# Patient Record
Sex: Female | Born: 1975 | Race: Black or African American | Hispanic: No | Marital: Married
Health system: Midwestern US, Community
[De-identification: ages and names within clinical notes are randomized; demographics above are authoritative.]

## PROBLEM LIST (undated history)

## (undated) DIAGNOSIS — Z860100 Personal history of colon polyps, unspecified: Principal | ICD-10-CM

## (undated) DIAGNOSIS — K449 Diaphragmatic hernia without obstruction or gangrene: Secondary | ICD-10-CM

## (undated) DIAGNOSIS — K219 Gastro-esophageal reflux disease without esophagitis: Secondary | ICD-10-CM

## (undated) DIAGNOSIS — K222 Esophageal obstruction: Secondary | ICD-10-CM

## (undated) HISTORY — PX: WISDOM TOOTH EXTRACTION: SHX21

## (undated) HISTORY — DX: Diaphragmatic hernia without obstruction or gangrene: K44.9

## (undated) HISTORY — PX: DIAGNOSTIC LAPAROSCOPY: SUR761

## (undated) HISTORY — DX: Gastro-esophageal reflux disease without esophagitis: K21.9

## (undated) HISTORY — PX: CHOLECYSTECTOMY: SHX55

## (undated) HISTORY — PX: TUBAL LIGATION: SHX77

## (undated) HISTORY — PX: BREAST REDUCTION SURGERY: SHX8

## (undated) HISTORY — PX: TONSILLECTOMY: SUR1361

## (undated) HISTORY — DX: Esophageal obstruction: K22.2

---

## 1997-11-02 ENCOUNTER — Other Ambulatory Visit: Admission: RE | Admit: 1997-11-02 | Discharge: 1997-11-02 | Payer: Self-pay | Admitting: Obstetrics and Gynecology

## 1998-11-03 ENCOUNTER — Other Ambulatory Visit: Admission: RE | Admit: 1998-11-03 | Discharge: 1998-11-03 | Payer: Self-pay | Admitting: Obstetrics and Gynecology

## 1998-11-27 ENCOUNTER — Emergency Department (HOSPITAL_COMMUNITY): Admission: EM | Admit: 1998-11-27 | Discharge: 1998-11-27 | Payer: Self-pay | Admitting: Emergency Medicine

## 1999-08-13 ENCOUNTER — Emergency Department (HOSPITAL_COMMUNITY): Admission: EM | Admit: 1999-08-13 | Discharge: 1999-08-13 | Payer: Self-pay | Admitting: Emergency Medicine

## 2000-07-30 ENCOUNTER — Other Ambulatory Visit: Admission: RE | Admit: 2000-07-30 | Discharge: 2000-07-30 | Payer: Self-pay | Admitting: Obstetrics and Gynecology

## 2001-06-06 ENCOUNTER — Encounter: Payer: Self-pay | Admitting: Internal Medicine

## 2001-06-06 ENCOUNTER — Ambulatory Visit (HOSPITAL_COMMUNITY): Admission: RE | Admit: 2001-06-06 | Discharge: 2001-06-06 | Payer: Self-pay | Admitting: Internal Medicine

## 2001-06-06 DIAGNOSIS — K449 Diaphragmatic hernia without obstruction or gangrene: Secondary | ICD-10-CM | POA: Insufficient documentation

## 2003-07-29 ENCOUNTER — Other Ambulatory Visit: Admission: RE | Admit: 2003-07-29 | Discharge: 2003-07-29 | Payer: Self-pay | Admitting: Obstetrics and Gynecology

## 2004-08-04 ENCOUNTER — Other Ambulatory Visit: Admission: RE | Admit: 2004-08-04 | Discharge: 2004-08-04 | Payer: Self-pay | Admitting: Obstetrics and Gynecology

## 2004-08-05 ENCOUNTER — Emergency Department (HOSPITAL_COMMUNITY): Admission: AD | Admit: 2004-08-05 | Discharge: 2004-08-05 | Payer: Self-pay | Admitting: Family Medicine

## 2004-10-26 ENCOUNTER — Ambulatory Visit: Payer: Self-pay | Admitting: Internal Medicine

## 2004-12-26 ENCOUNTER — Ambulatory Visit: Payer: Self-pay | Admitting: Internal Medicine

## 2004-12-30 ENCOUNTER — Inpatient Hospital Stay (HOSPITAL_COMMUNITY): Admission: AD | Admit: 2004-12-30 | Discharge: 2004-12-30 | Payer: Self-pay | Admitting: Obstetrics and Gynecology

## 2005-02-21 ENCOUNTER — Other Ambulatory Visit (HOSPITAL_COMMUNITY): Admission: RE | Admit: 2005-02-21 | Discharge: 2005-02-27 | Payer: Self-pay | Admitting: Psychiatry

## 2005-02-21 ENCOUNTER — Ambulatory Visit: Payer: Self-pay | Admitting: Psychiatry

## 2005-07-04 ENCOUNTER — Inpatient Hospital Stay (HOSPITAL_COMMUNITY): Admission: AD | Admit: 2005-07-04 | Discharge: 2005-07-07 | Payer: Self-pay | Admitting: Obstetrics and Gynecology

## 2005-08-31 ENCOUNTER — Other Ambulatory Visit: Admission: RE | Admit: 2005-08-31 | Discharge: 2005-08-31 | Payer: Self-pay | Admitting: Obstetrics and Gynecology

## 2005-09-21 ENCOUNTER — Other Ambulatory Visit: Admission: RE | Admit: 2005-09-21 | Discharge: 2005-09-21 | Payer: Self-pay | Admitting: Obstetrics and Gynecology

## 2006-02-11 ENCOUNTER — Other Ambulatory Visit: Admission: RE | Admit: 2006-02-11 | Discharge: 2006-02-11 | Payer: Self-pay | Admitting: Obstetrics and Gynecology

## 2006-05-24 ENCOUNTER — Encounter (INDEPENDENT_AMBULATORY_CARE_PROVIDER_SITE_OTHER): Payer: Self-pay | Admitting: *Deleted

## 2006-05-24 ENCOUNTER — Ambulatory Visit (HOSPITAL_COMMUNITY): Admission: RE | Admit: 2006-05-24 | Discharge: 2006-05-24 | Payer: Self-pay | Admitting: Obstetrics and Gynecology

## 2006-06-20 ENCOUNTER — Ambulatory Visit: Payer: Self-pay | Admitting: Internal Medicine

## 2006-08-16 ENCOUNTER — Emergency Department (HOSPITAL_COMMUNITY): Admission: EM | Admit: 2006-08-16 | Discharge: 2006-08-17 | Payer: Self-pay | Admitting: *Deleted

## 2007-03-05 ENCOUNTER — Ambulatory Visit: Payer: Self-pay | Admitting: Internal Medicine

## 2007-05-10 DIAGNOSIS — K222 Esophageal obstruction: Secondary | ICD-10-CM

## 2007-05-10 DIAGNOSIS — K219 Gastro-esophageal reflux disease without esophagitis: Secondary | ICD-10-CM | POA: Insufficient documentation

## 2007-05-10 DIAGNOSIS — K319 Disease of stomach and duodenum, unspecified: Secondary | ICD-10-CM

## 2007-10-29 ENCOUNTER — Encounter: Payer: Self-pay | Admitting: Internal Medicine

## 2007-11-05 ENCOUNTER — Telehealth: Payer: Self-pay | Admitting: Internal Medicine

## 2007-12-23 ENCOUNTER — Inpatient Hospital Stay (HOSPITAL_COMMUNITY): Admission: AD | Admit: 2007-12-23 | Discharge: 2007-12-23 | Payer: Self-pay | Admitting: Obstetrics and Gynecology

## 2008-04-20 ENCOUNTER — Telehealth: Payer: Self-pay | Admitting: Internal Medicine

## 2008-06-25 ENCOUNTER — Inpatient Hospital Stay (HOSPITAL_COMMUNITY): Admission: AD | Admit: 2008-06-25 | Discharge: 2008-06-25 | Payer: Self-pay | Admitting: Obstetrics and Gynecology

## 2008-07-25 ENCOUNTER — Inpatient Hospital Stay (HOSPITAL_COMMUNITY): Admission: AD | Admit: 2008-07-25 | Discharge: 2008-07-25 | Payer: Self-pay | Admitting: Obstetrics and Gynecology

## 2008-07-27 ENCOUNTER — Inpatient Hospital Stay (HOSPITAL_COMMUNITY): Admission: AD | Admit: 2008-07-27 | Discharge: 2008-07-30 | Payer: Self-pay | Admitting: Obstetrics and Gynecology

## 2008-07-27 ENCOUNTER — Encounter (INDEPENDENT_AMBULATORY_CARE_PROVIDER_SITE_OTHER): Payer: Self-pay | Admitting: Obstetrics and Gynecology

## 2008-11-23 ENCOUNTER — Ambulatory Visit: Payer: Self-pay | Admitting: Internal Medicine

## 2008-11-23 DIAGNOSIS — R1013 Epigastric pain: Secondary | ICD-10-CM

## 2008-11-23 LAB — CONVERTED CEMR LAB
AST: 16 units/L (ref 0–37)
Albumin: 3.4 g/dL — ABNORMAL LOW (ref 3.5–5.2)
Basophils Absolute: 0 10*3/uL (ref 0.0–0.1)
CO2: 30 meq/L (ref 19–32)
GFR calc non Af Amer: 123.02 mL/min (ref >60)
Lipase: 9 units/L — ABNORMAL LOW (ref 11.0–59.0)
Lymphocytes Relative: 25.3 % (ref 12.0–46.0)
Lymphs Abs: 1.7 10*3/uL (ref 0.7–4.0)
MCHC: 34.2 g/dL (ref 30.0–36.0)
Monocytes Absolute: 0.5 10*3/uL (ref 0.1–1.0)
Monocytes Relative: 7.2 % (ref 3.0–12.0)
Neutro Abs: 4.3 10*3/uL (ref 1.4–7.7)
Potassium: 4.1 meq/L (ref 3.5–5.1)
Sodium: 140 meq/L (ref 135–145)
Total Bilirubin: 0.8 mg/dL (ref 0.3–1.2)
WBC: 6.9 10*3/uL (ref 4.5–10.5)

## 2008-11-26 ENCOUNTER — Ambulatory Visit (HOSPITAL_COMMUNITY): Admission: RE | Admit: 2008-11-26 | Discharge: 2008-11-26 | Payer: Self-pay | Admitting: Internal Medicine

## 2008-11-26 DIAGNOSIS — K8020 Calculus of gallbladder without cholecystitis without obstruction: Secondary | ICD-10-CM | POA: Insufficient documentation

## 2008-11-29 ENCOUNTER — Encounter: Payer: Self-pay | Admitting: Internal Medicine

## 2008-12-13 ENCOUNTER — Encounter: Payer: Self-pay | Admitting: Internal Medicine

## 2008-12-31 ENCOUNTER — Emergency Department (HOSPITAL_COMMUNITY): Admission: EM | Admit: 2008-12-31 | Discharge: 2008-12-31 | Payer: Self-pay | Admitting: Family Medicine

## 2009-01-13 ENCOUNTER — Encounter (INDEPENDENT_AMBULATORY_CARE_PROVIDER_SITE_OTHER): Payer: Self-pay | Admitting: General Surgery

## 2009-01-13 ENCOUNTER — Ambulatory Visit (HOSPITAL_COMMUNITY): Admission: RE | Admit: 2009-01-13 | Discharge: 2009-01-13 | Payer: Self-pay | Admitting: General Surgery

## 2009-02-01 ENCOUNTER — Encounter: Payer: Self-pay | Admitting: Internal Medicine

## 2010-01-18 ENCOUNTER — Telehealth: Payer: Self-pay | Admitting: Internal Medicine

## 2010-02-01 ENCOUNTER — Ambulatory Visit: Payer: Self-pay | Admitting: Internal Medicine

## 2010-05-18 NOTE — Assessment & Plan Note (Signed)
Summary: GERD-annual followup   History of Present Illness Visit Type: Follow-up Visit Primary GI MD: Lauren Flemings MD Primary Provider: Velna Ward M.D. Chief Complaint: needs refills of Omeprazole  No GI complaints History of Present Illness:   35 year old female with morbid obesity and GERD consultative a peptic stricture. She presents today for routine office followup and requests a refill of her omeprazole. She was last evaluated in August of 2010 regarding a four-month history of recurrent epigastric pain. She underwent workup including laboratories and abdominal ultrasound. She was found to have cholelithiasis. She subsequently underwent laparoscopic cholecystectomy in September 2010. Her pain resolved. She continues on omeprazole 20 mg daily for her GERD. On medication no reflux symptoms. No problems with dysphagia. No appreciable medication side effects. No interval medical problems. No other GI complaints.   GI Review of Systems      Denies abdominal pain, acid reflux, belching, bloating, chest pain, dysphagia with liquids, dysphagia with solids, heartburn, loss of appetite, nausea, vomiting, vomiting blood, weight loss, and  weight gain.        Denies anal fissure, black tarry stools, change in bowel habit, constipation, diarrhea, diverticulosis, fecal incontinence, heme positive stool, hemorrhoids, irritable bowel syndrome, jaundice, light color stool, liver problems, rectal bleeding, and  rectal pain.    Current Medications (verified): 1)  Omeprazole 20 Mg Tbec (Omeprazole) .... Take 1 Capsule By Mouth Two Times A Day  Allergies (verified): No Known Drug Allergies  Past History:  Past Medical History: Reviewed history from 05/10/2007 and no changes required. 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 No FH of Colon Cancer: Family History of Diabetes: MGM, PGM  Social History: Reviewed history from 11/23/2008 and no changes required. 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  Alcohol Use - no Daily Caffeine Use 2 can sodas/day Illicit Drug Use - no Patient does not get regular exercise.   Review of Systems  The patient denies allergy/sinus, anemia, anxiety-new, arthritis/joint pain, back pain, blood in urine, breast changes/lumps, change in vision, confusion, cough, coughing up blood, depression-new, fainting, fatigue, fever, headaches-new, hearing problems, heart murmur, heart rhythm changes, itching, menstrual pain, muscle pains/cramps, night sweats, nosebleeds, pregnancy symptoms, shortness of breath, skin rash, sleeping problems, sore throat, swelling of feet/legs, swollen lymph glands, thirst - excessive , urination - excessive , urination changes/pain, urine leakage, vision changes, and voice change.    Vital Signs:  Patient profile:   35 year old female Height:      62 inches Weight:      229 pounds BMI:     42.04 Pulse rate:   84 / minute Pulse rhythm:   regular BP sitting:   118 / 74  (left arm)  Vitals Entered By: Milford Cage NCMA (February 01, 2010 11:15 AM)  Physical Exam  General:  Well developed,obese, well nourished, no acute distress. Head:  Normocephalic and atraumatic. Eyes:  PERRLA, no icterus. Mouth:  No deformity or lesions, dentition normal. Neck:  Supple; no masses or thyromegaly. Lungs:  Clear throughout to auscultation. Heart:  Regular rate and rhythm; no murmurs, rubs,  or bruits. Abdomen:  Soft,obese, nontender and nondistended. No masses, hepatosplenomegaly or hernias noted. Normal bowel sounds. Pulses:  Normal pulses noted. Extremities:  no edema Neurologic:  alert and oriented Skin:  no rash or jaundice Psych:  Alert and cooperative. Normal  mood and affect.   Impression  & Recommendations:  Problem # 1:  GERD (ICD-530.81) GERD with history of peptic stricture. Currently asymptomatic on PPI therapy. We discussed the current state of her management.  Plan #1. Continue PPI as she requires this for adequate control of symptoms and to reduce the risk of recurrent symptomatic stricture formation #2. Reflux precautions with significant attention to weight loss #3. Routine GI followup in 1-2 years.  Patient Instructions: 1)  Refill Omeprazole 20 mg #180 x 3 RFS printed and given to pt. 2)  Please schedule a follow-up appointment in 1-2 years. 3)  The medication list was reviewed and reconciled.  All changed / newly prescribed medications were explained.  A complete medication list was provided to the patient / caregiver. 4)  Copy: Dr. Velna Ward Prescriptions: OMEPRAZOLE 20 MG TBEC (OMEPRAZOLE) Take 1 capsule by mouth two times a day  #180 x 3   Entered by:   Milford Cage NCMA   Authorized by:   Hilarie Fredrickson MD   Signed by:   Milford Cage NCMA on 02/01/2010   Method used:   Print then Give to Patient   RxID:   [id number]

## 2010-05-18 NOTE — Progress Notes (Signed)
Summary: Samples of Nexium  Phone Note Call from Patient Call back at Work Phone [phone]   Call For: Dr Marina Goodell Summary of Call: Wonders if there are any Nexium samples she can have? Initial call taken by: Leanor Kail [facility],  January 18, 2010 8:18 AM  Follow-up for Phone Call        Samples left up front for pt. to pick up.  Pt. notified.   Follow-up by: Milford Cage NCMA,  January 18, 2010 9:57 AM

## 2010-05-22 ENCOUNTER — Emergency Department (HOSPITAL_COMMUNITY)
Admission: EM | Admit: 2010-05-22 | Discharge: 2010-05-22 | Disposition: A | Discharge status: Home / Self Care | Payer: BC Managed Care – HMO | Attending: Emergency Medicine | Admitting: Emergency Medicine

## 2010-05-22 ENCOUNTER — Emergency Department (HOSPITAL_COMMUNITY): Payer: BC Managed Care – HMO

## 2010-05-22 DIAGNOSIS — R079 Chest pain, unspecified: Secondary | ICD-10-CM | POA: Insufficient documentation

## 2010-05-22 DIAGNOSIS — K219 Gastro-esophageal reflux disease without esophagitis: Secondary | ICD-10-CM | POA: Insufficient documentation

## 2010-05-22 DIAGNOSIS — D649 Anemia, unspecified: Secondary | ICD-10-CM | POA: Insufficient documentation

## 2010-05-22 LAB — DIFFERENTIAL
Basophils Absolute: 0 10*3/uL (ref 0.0–0.1)
Basophils Relative: 0 % (ref 0–1)
Eosinophils Relative: 5 % (ref 0–5)
Lymphocytes Relative: 23 % (ref 12–46)
Monocytes Relative: 9 % (ref 3–12)
Neutrophils Relative %: 63 % (ref 43–77)

## 2010-05-22 LAB — CBC
MCH: 26.8 pg (ref 26.0–34.0)
MCV: 81.3 fL (ref 78.0–100.0)
Platelets: 290 10*3/uL (ref 150–400)
RBC: 4.07 MIL/uL (ref 3.87–5.11)
RDW: 14.3 % (ref 11.5–15.5)

## 2010-05-22 LAB — POCT CARDIAC MARKERS
CKMB, poc: <1 ng/mL — ABNORMAL LOW (ref 1.0–8.0)
Troponin i, poc: 0.06 ng/mL (ref 0.00–0.09)
Troponin i, poc: <0.05 ng/mL (ref 0.00–0.09)

## 2010-05-22 LAB — BASIC METABOLIC PANEL
CO2: 28 mEq/L (ref 19–32)
Creatinine, Ser: 0.7 mg/dL (ref 0.4–1.2)
GFR calc non Af Amer: >60 mL/min (ref >60)
Potassium: 3.7 mEq/L (ref 3.5–5.1)

## 2010-06-07 ENCOUNTER — Other Ambulatory Visit: Payer: Self-pay | Admitting: Internal Medicine

## 2010-06-13 ENCOUNTER — Ambulatory Visit
Admission: RE | Admit: 2010-06-13 | Discharge: 2010-06-13 | Disposition: A | Discharge status: Home / Self Care | Payer: BC Managed Care – HMO | Source: Ambulatory Visit | Attending: Internal Medicine | Admitting: Internal Medicine

## 2010-07-21 LAB — DIFFERENTIAL
Basophils Absolute: 0 10*3/uL (ref 0.0–0.1)
Basophils Relative: 0 % (ref 0–1)

## 2010-07-21 LAB — COMPREHENSIVE METABOLIC PANEL
CO2: 27 mEq/L (ref 19–32)
Chloride: 107 mEq/L (ref 96–112)
Glucose, Bld: 102 mg/dL — ABNORMAL HIGH (ref 70–99)
Potassium: 3.6 mEq/L (ref 3.5–5.1)
Sodium: 140 mEq/L (ref 135–145)

## 2010-07-21 LAB — CBC
Hemoglobin: 12.8 g/dL (ref 12.0–15.0)
MCV: 87.4 fL (ref 78.0–100.0)
Platelets: 287 10*3/uL (ref 150–400)
WBC: 7.5 10*3/uL (ref 4.0–10.5)

## 2010-07-21 LAB — PREGNANCY, URINE: Preg Test, Ur: NEGATIVE

## 2010-07-26 LAB — CBC
HCT: 32 % — ABNORMAL LOW (ref 36.0–46.0)
Hemoglobin: 11.1 g/dL — ABNORMAL LOW (ref 12.0–15.0)
MCHC: 34.5 g/dL (ref 30.0–36.0)
MCV: 90.7 fL (ref 78.0–100.0)
Platelets: 178 10*3/uL (ref 150–400)
Platelets: 203 10*3/uL (ref 150–400)
RBC: 3.53 MIL/uL — ABNORMAL LOW (ref 3.87–5.11)
RDW: 14.5 % (ref 11.5–15.5)
RDW: 14.6 % (ref 11.5–15.5)
WBC: 10.6 10*3/uL — ABNORMAL HIGH (ref 4.0–10.5)
WBC: 11.4 10*3/uL — ABNORMAL HIGH (ref 4.0–10.5)

## 2010-07-26 LAB — GLUCOSE, CAPILLARY

## 2010-07-26 LAB — RPR: RPR Ser Ql: NONREACTIVE

## 2010-07-26 LAB — WET PREP, GENITAL: Clue Cells Wet Prep HPF POC: NONE SEEN

## 2010-07-27 LAB — URINALYSIS, ROUTINE W REFLEX MICROSCOPIC
Bilirubin Urine: NEGATIVE
Ketones, ur: NEGATIVE mg/dL
Nitrite: NEGATIVE
Specific Gravity, Urine: <1.005 — ABNORMAL LOW (ref 1.005–1.030)
Urobilinogen, UA: 0.2 mg/dL (ref 0.0–1.0)
pH: 6 (ref 5.0–8.0)

## 2010-08-29 NOTE — H&P (Signed)
Lauren Ward, Lauren Ward             ACCOUNT NO.:  [id number]   MEDICAL RECORD NO.:  [id number]          PATIENT TYPE:  OBV   LOCATION:  9162                          [facility]ACILITY:  [facility]   PHYSICIAN:  [name], M.D. DATE OF BIRTH:  07/03/1975   DATE OF ADMISSION:  07/27/2008  DATE OF DISCHARGE:                              HISTORY & PHYSICAL   Ms. Drawdy is a 35 year old gravida 4, para 1-0-2-1 at 39-4/7 weeks who  presents with spontaneous rupture of membranes approximately 9:30 a.m.  with clear fluid noted and with very occasional contractions.  Her  pregnancy has been remarkable for:  1. Large-for-gestational age anticipated infant with estimated fetal      weight 8.14 on March 31.  2. Group B strep negative.  3. Elevated BMI.  4. First-trimester spotting.  5. Gastroesophageal reflux disease.  6. Desires tubal sterilization.   PRENATAL LABS:  Blood type is AB positive, Rh antibody negative, VDRL  nonreactive, rubella titer positive, hepatitis B surface antigen  negative, HIV is nonreactive.  Sickle cell test was negative.  Hemoglobin upon entering the practice was 12.  It was within normal  limits at 28 weeks.  The patient had a normal first-trimester screen.  She declined AFP.  She had a normal Glucola.  Group B strep culture was  negative at 36 weeks.  GC and Chlamydia cultures were also negative at  that time.   HISTORY OF PRESENT PREGNANCY:  The patient entered care at approximately  11 weeks 3 days.  She had an ultrasound at that time with an [facility] of  July 30, 2008 in agreement with [facility] by LMP.  She had a normal first  trimester screen.  She declined AFP.  She had some ligament pain at 19  weeks.  She had an ultrasound at 19 weeks showing an anterior placenta,  normal cervical length, normal growth and fluid.  She received H1N1 at  19 weeks.  At 23 weeks, there was a __________noted.  She had a normal  Glucola.  By 32 weeks, she was measuring size larger than dates.   She  had an ultrasound at 35 weeks showing estimated fetal weight at 7 pounds  8 ounces, BPP of 8/8.  Group B strep culture was negative at 36 weeks.  She had another ultrasound on 03/31 showing estimated fetal weight  greater than 90th percentile with a weight of 8 pounds 14 ounces.  Cervix at that time was 1, 50%, -3.  The rest her pregnancy was  essentially uncomplicated and then she broke her water this morning.   OBSTETRICAL HISTORY:  In 2007 she had a vaginal birth of a female infant  weight 7 pounds 2 ounces of 40 weeks.  She was in labor 9 hours from the  onset of Pitocin use.  She did have spontaneous contractions and  progressed well once Pitocin was begun and she did have an epidural.  In  2008 she had a blighted ovum at 7 weeks and had a D and C per Dr.  Estanislado Pandy.  She had elective termination in 1994.   MEDICAL HISTORY:  She was on oral contraceptives until June 2009.  She  had one abnormal Pap in the past.  Followup was normal.  She has history  of reflux.   She has a sensitivity to LATEX but no anaphylaxis.   PREVIOUS SURGERY:  Includes right toe, tonsils and adenoidectomy in 2008  and a D and C in 2008 for the spontaneous miscarriage.  She also had a  previous elective termination of pregnancy in 1994.   FAMILY HISTORY:  Her father had an MI.  Paternal grandfather had an MI.  Her mother has hypertension and her father has hypertension.  Her  maternal uncle had a leg DVT.  Her mother has anemia.  Her father and  two brothers have asthma.  Paternal uncle has diabetes.  Paternal and  maternal grandmothers have diabetes and another paternal uncle has  diabetes.   GENETIC HISTORY:  Unremarkable.   SOCIAL HISTORY:  The patient is married to father of the baby.  He is  involved in sports and his name is Engineer, manufacturing systems.  The patient is  Tree surgeon.  She denies a religious affiliation.  She is a Statistician.  She is employed in Clinical biochemist.  Her husband has  an  associates degree.  He is a bus Hospital doctor.  She has been followed by the  certified nurse midwife service, [place].  She denies any  alcohol, drug or tobacco use during this pregnancy.   PHYSICAL EXAMINATION:  VITAL SIGNS:  Stable.  Patient is afebrile.  HEENT: Within normal limits.  LUNGS:  Breath sounds are clear.  HEART:  Regular rate and rhythm without murmur.  BREASTS:  Soft and nontender.  ABDOMEN:  Fundal height is approximately 44 cm, estimated fetal weight  is somewhere at 9 to 9-1/2 pounds.  Uterine contractions are very  occasional and mild.  Fetal heart rate is reactive.  There is some  occasional very mild variables noted.  The patient is noted to be  leaking clear fluid and she is having very sporadic mild contractions.  Cervix is 2 cm, 60%, vertex at -2 station with clear fluid noted.  EXTREMITIES:  Deep tendon reflexes are 2+ without clonus.  There is a  trace edema noted.   IMPRESSION:  1. Intrauterine pregnancy at 39-4/7 weeks.  2. Spontaneous rupture of membranes without labor.  3. Group B strep negative.  4. Large-for-gestational age infant.  5. Desires tubal sterilization.   PLAN:  1. Admit to birthing suite with consult with Dr. Normand Sloop as attending      physician.  2. Routine certified nurse midwife orders.  3. Will observe at present and plan Pitocin augmentation if no change      in labor status by 1:30 to 2 o'clock per patient request.  4. Patient plans epidural.  5. Reviewed risk of shoulder dystocia in light of a possible LGA      infant with the patient and her husband.  We will plan MD presence      at delivery and we will watch with close observation of labor      progress.     Renaldo Reel Emilee Hero, C.N.M.      Naima A. Normand Sloop, M.D.  Electronically Signed   VLL/MEDQ  D:  07/27/2008  T:  07/27/2008  Job:  [id number]

## 2010-08-29 NOTE — Discharge Summary (Signed)
[name], Ward             ACCOUNT NO.:  [id number]   MEDICAL RECORD NO.:  [id number]           PATIENT TYPE:   LOCATION:                                 FACILITY:   PHYSICIAN:  Crist Fat. Rivard, M.D.      DATE OF BIRTH:   DATE OF ADMISSION:  07/27/2008  DATE OF DISCHARGE:  07/30/2008                               DISCHARGE SUMMARY   ADMITTING DIAGNOSIS:  1. Intrauterine pregnancy of term.  2. Fetal macrosomia.  3. Spontaneous rupture of membranes.  4. Desires sterilization.   DISCHARGE DIAGNOSES:  Same.   PROCEDURES:  1. Primary low transverse cesarean section.   HOSPITAL COURSE:  Lauren Ward is a 35 year old gravida 3, para 1-0-1-1 at  39-4/7 weeks, who presented on the morning of July 27, 2008, with  spontaneous ruptured membranes at approximately 9:30 a.m.  Clear fluid  noted and very occasional uterine contractions.  Pregnancy had been  remarkable for:  1. Large for gestational age with estimated fetal weight of 814 on      03/31.  2. Group B strep negative.  3. Elevated BMI.  4. First trimester spotting.  5. Gastroesophageal reflux disease.  6. Desires tubal sterilization.   On arrival, the patient's vital signs were stable.  She was afebrile.  Fetal heart rate was reactive with very mild variables.  Uterine  contractions very sporadic and mild.  She was leaking clear fluid.  Her  cervix was 260% vertex 7, minus 2 station with clear fluid noted.  Dr.  Normand Sloop was consulted, and initially the decision was made to observe  for onset of labor, risks of shoulder dystocia were reviewed with the  patient, her husband.  Dr. Normand Sloop was consulted, and she suggested that  if the patient would be interested in choosing a cesarean section should  estimated fetal weight be 5000 grams or greater, then we would do an  ultrasound for estimated fetal weight.  The patient did wish to proceed  with this plan, and ultrasound was done, showing an estimated fetal  weight of 11  pounds 7 ounces which was 5100 grams. The patient did wish  to proceed with cesarean section subsequent to that finding and that the  plan was made.  The findings were reviewed with her husband which she,  and her husband, and risks, and benefits of C-section including  bleeding, infection, damage to other organs, and failure of tubal  ligation were reviewed with the patient.  She did wish to proceed then  with those procedures.  She was taken to the operating room.   PROCEDURES:  She is taken to the operating room where primary low  transverse cesarean section and bilateral tubal sterilization were  performed on the patient by Dr. Normand Sloop.  Please add to the procedures  bilateral tubal ligation in addition to the primary low transverse  cesarean section.   FINDINGS:  Findings were a viable female, weight 10 pounds 15 ounces.  Apgars were 8 and 9.  Infant was taken to the full-term nursery.  Mother  was taken to recovery in good condition.  Postop day #1, the patient is doing well.  She was up ad lib without  syncope.  She was bottle feeding. Her hemoglobin was 11.1, white blood  cell count 10.6, and platelet count was 178.  JP drain had inadvertently  been dislodged  in recovery on 04/13.  That site was clean, dry and  intact and the patient's incision was doing well.  The rest of the  patient's hospital course was uncomplicated.   By post op day #3 she was doing well.  Her incision was clean, dry and  intact.  Fundus was firm.  Lochia was scant.  The infant was doing well.  The patient had good pain management on p.o. pain medications and her  condition was stable.  She was deemed to receive full benefit of  hospital stay and was discharged home.  Discharge instructions per  [facility] handout.   DISCHARGE MEDICATIONS:  Motrin 600 mg p.o. q.6 h p.r.n. pain, Percocet  5/325 one to two p.o. daily 4 hours p.r.n. pain.  HCTZ 25 mg one p.o.  daily was given to the patient  secondary to some increased edema of her  legs.  Discharge followup occur in 6 weeks [facility].      Lauren Ward, C.N.M.      Crist Fat Rivard, M.D.  Electronically Signed    VLL/MEDQ  D:  07/30/2008  T:  07/31/2008  Job:  [id number]

## 2010-08-29 NOTE — Op Note (Signed)
Lauren Ward, BOEHM             ACCOUNT NO.:  [id number]   MEDICAL RECORD NO.:  [id number]          PATIENT TYPE:  INP   LOCATION:  9104                          FACILITY:  [facility]   PHYSICIAN:  [name], M.D. DATE OF BIRTH:  1976/02/23   DATE OF PROCEDURE:  07/27/2008  DATE OF DISCHARGE:                               OPERATIVE REPORT   PREOPERATIVE DIAGNOSES:  Intrauterine pregnancy at term, fetal  macrosomia, spontaneous rupture of membranes, desires sterilization.   POSTOPERATIVE DIAGNOSES:  Intrauterine pregnancy at term, fetal  macrosomia, spontaneous rupture of membranes, desires sterilization.   PROCEDURE:  Primary low-transverse cesarean section and bilateral tubal  ligation.   SURGEON:  Naima A. Normand Sloop, MD   ASSISTANT:  Renaldo Reel. Emilee Hero, C.N.M.   ANESTHESIA:  Spinal.   FINDINGS:  A female on vertex presentation with clear fluid.  Apgars  were 8 and 9 with a weight of 1015.  Normal appearing uterus, tubes and  ovaries and abdominal anatomy.   ESTIMATED BLOOD LOSS:  600 mL.   URINE OUTPUT:  150 mL clear urine at the end of procedure.   IV FLUIDS:  2800 mL crystalloid.   COMPLICATIONS:  There were no complications.  The patient went to PACU  in stable condition.   PROCEDURE IN DETAIL:  Before the surgery, the patient appeared to have  very large baby, and we talked to her about the risk of shoulder  dystocia.  The patient decided to have an ultrasound.  Ultrasound  measurements, the was an estimated fetal weight of 10-11 pounds, plus or  minus a pound.  The patient was offered a C-section and she decided to  proceed.  She understood the risk but are not limited to bleeding,  infection, damage to internal organs such as bowel, bladder, major blood  vessels.  She also wanted a tubal ligation.  The birth control was  reviewed with the patient.  The patient understood the risk of tubal to  be bleeding, infection, and ectopic pregnancy, or pregnancy in about 1  in 200 patients who have the tubal, 50% of those could result in  ectopic.  The patient understood and decided to proceed.  She was taken  to the operating room, placed in dorsal supine position after spinal  anesthesia was placed.  A spinal incision was found to be adequate.  A  Pfannenstiel skin incision was made with the scalpel and carried down to  the fascia using Bovie cautery.  The fascia was incised in the midline,  extended bilaterally using Mayo.  Kochers x2 were placed in the superior  aspect of the fascia was dissected off the rectus muscle both sharply  and bluntly.  The inferior aspect of the fascia was dissected off the  rectus muscle in a similar fashion.  Rectus muscle was separated in the  midline.  Peritoneum was identified, tented up and entered sharply, and  extended bluntly.  A bladder blade was placed.  Vesicouterine peritoneum  was identified, tented up and entered sharply, and bladder flap created  digitally.  Bladder blade was replaced.  Primary low transverse uterine  incision was made with the scalpel and extended transversely and  bluntly.  The amniotic sac was then ruptured without clamps.  There was  still some clear fluid in there.  The infant was then delivered using  vacuum one pull in the green zone in the proper position.  There was no  nuchal cord.  Body was delivered without difficulty.  Cord was clamped  and cut.  Placenta was manually delivered.  The uterus was cleared of  all clots and debris.  Uterine incision was repaired with 0 Vicryl in a  running lock fashion.  Secondly, a 0 Vicryl was used to imbricate the  uterus.  The patient's left fallopian tube was grasped with Babcock  clamp.  The mid isthmic portion was ligated with 2-0 plain and excised.  Hemostasis was assured.  The patient had normal-appearing ovaries and  abdominal anatomy.  The patient's right fallopian tube was grasped with  Babcock clamp.  A 1.5 cm of the mid isthmic portion of  the tube was  ligated with 2-0 plain and excised.  Bovie cautery was needed to assure  hemostasis.  Irrigation was done in the abdomen.  All areas were noted  to be hemostatic.  The muscles were reapproximated using 0 Vicryl with 2  figure-of-eight stitches.  The fascia was reapproximated using 0 Vicryl  in a running fashion.  A JP drain was placed in the subcutaneous tissue.  Subcutaneous tissue was reapproximated using 2-0 plain.  Skin was closed  with 3-0 Monocryl in a subcuticular fashion.  Sponge, lap, and needle  counts were correct.  The patient went to recovery room in a stable  condition.      Naima A. Normand Sloop, M.D.  Electronically Signed     NAD/MEDQ  D:  07/27/2008  T:  07/28/2008  Job:  [id number]

## 2010-08-29 NOTE — Assessment & Plan Note (Signed)
[facility]                         GASTROENTEROLOGY OFFICE NOTE   YARIELYS, BEED                    MRN:          [id number]  DATE:03/05/2007                            DOB:          [birth year]    HISTORY:  Caniyah presents today regarding transient problems with  epigastric burning, regurgitation and dysphagia.  She is a 35 year old  with a history of gastroesophageal reflux disease complicated by peptic  stricture.  She has undergone prior upper endoscopy though no dilation.  For her reflux disease, she has been maintained on omeprazole 20 mg  daily. She had previously been on Nexium.  In May, she complained of  problems with epigastric burning discomfort relieved with Pepto-Bismol.  Also, regurgitation, globus sensation and dysphagia.  Nocturnal  regurgitation.  Symptoms persisted for about one week and then resolved.  She had recurrence of symptoms in September, again lasting one week.  No  vomiting, change in bowel habits, melena, hematochezia, right upper  quadrant pain, back pain, or change in urine color.  Currently she is  asymptomatic.  She takes her medicines in the morning on an empty  stomach.  She has had gradual weight gain.   MEDICATIONS:  Her only medications are omeprazole 20 mg daily and birth  control pills daily.   ALLERGIES:  She has no known drug allergies.   PHYSICAL EXAMINATION:  GENERAL APPEARANCE:  Physical examination finds a  well-appearing female in no acute distress.  VITAL SIGNS:  Blood pressure 122/72.  Heart rate is 72.  Weight is 210.6  pounds.  HEENT:  Sclerae anicteric.  LUNGS:  Clear.  HEART:  Regular.  ABDOMEN:  Obese and soft without tenderness, mass or hernia.  Good bowel  sounds heard.   IMPRESSION:  Gastroesophageal reflux disease.  The patient's symptoms in  May and again in September most certainly represent break through of her  reflux symptoms.  This may be due to a change from Nexium to  omeprazole.  Alternatively, it may be the result of progressive weight gain (or even  a combination of the two).  In any event, doing well at this time.  I do  not suspect other causes or problems.  As she is having no dysphagia  now, I see no need to perform endoscopy with esophageal dilatation.   RECOMMENDATIONS:  1. Continue omeprazole 20 mg daily.  2. Reflux precautions with attention to weight loss.  3. If break through symptoms occur in the future, then increase      omeprazole to b.i.d.  4. Routine followup in one year unless interval questions or problems.    Wilhemina Bonito. Marina Goodell, MD  Electronically Signed   JNP/MedQ  DD: 03/05/2007  DT: 03/06/2007  Job #: [id number]

## 2010-09-01 NOTE — Op Note (Signed)
[name], Ward             ACCOUNT NO.:  [id number]   MEDICAL RECORD NO.:  [id number]          PATIENT TYPE:  AMB   LOCATION:  SDC                           FACILITY:  [facility]   PHYSICIAN:  Dois Davenport A. Rivard, M.D. DATE OF BIRTH:  Oct 01, 1975   DATE OF PROCEDURE:  05/24/2006  DATE OF DISCHARGE:                               OPERATIVE REPORT   PREOPERATIVE DIAGNOSIS:  Blighted ovum.   POSTOPERATIVE DIAGNOSIS:  Blighted ovum.   ANESTHESIA:  IV sedation with paracervical block.   PROCEDURE:  D&E.   SURGEON:  Dr. Estanislado Pandy.   ESTIMATED BLOOD LOSS:  Minimal.   PROCEDURE:  After being informed of the planned procedure with possible  complications including bleeding, infection, retained products and  uterine perforation, informed consent is obtained.  The patient is taken  to OR #7, given IV sedation and placed in the lithotomy position.  She  is prepped and draped in a sterile fashion, and her bladder is emptied  with an in-and-out Foley catheter.  Pelvic exam reveals an anteverted  uterus, 8-10 weeks in size.  Adnexa are not felt to due to the patient's  body habitus.   A weighted speculum is inserted.  Anterior lip of the cervix is grasped  with a tenaculum forceps, and we proceed with a paracervical block using  Novocain 1% 20 mL in the usual fashion.  Uterus was then sounded at 12  cm, and the cervix was easily dilated with Hagar dilator until #31.   Using a #9 curved cannula, we evacuate products of conception easily.  After evacuation is complete, a sharp curette is used to assess the  endometrial cavity which is felt to be free of products of conception.  Instruments are then removed.  Hemostasis is adequate.  Instrument and  sponge count is complete x2.  Estimated blood loss is minimal.  The  procedure is very well tolerated by the patient who is taken to recovery  room in a well and stable condition.      Crist Fat Rivard, M.D.  Electronically Signed     SAR/MEDQ   D:  05/24/2006  T:  05/24/2006  Job:  [id number]

## 2010-09-01 NOTE — H&P (Signed)
Lauren Ward             ACCOUNT NO.:  [id number]   MEDICAL RECORD NO.:  [id number]          PATIENT TYPE:  INP   LOCATION:  9170                          [facility]ACILITY:  [facility]   PHYSICIAN:  Crist Fat. Rivard, M.D. DATE OF BIRTH:  [birth year]   DATE OF ADMISSION:  07/04/2005  DATE OF DISCHARGE:                                HISTORY & PHYSICAL   HISTORY OF PRESENT ILLNESS:  [name] is a 35 year old gravida 2, para 0-0-  1-0, at 45 and 1/7ths weeks who presents with spontaneous rupture of  membranes at approximately 6:15 am this morning.  It is now 10 a.m.  The  patient has had occasional uterine contractions since then.  She denies any  bleeding.  Reports positive fetal movement, and reports the fluid is clear.  The cervix in the office this week was closed, 50%.   The pregnancy has been remarkable for:  1.  Elevated BMI.  2.  First trimester spotting.  3.  History of gastroesophageal reflux disease.  4.  History of frequent tonsillitis.  5.  Negative group B strep.   PRENATAL LABORATORIES:  Blood type is AB positive, Rh antibody negative,  VDRL nonreactive, rubella titer positive, hepatitis B surface antigen  negative.  HIV is nonreactive.  Sickle cell test is negative.  GC and  Chlamydia cultures were negative in August and at 37 weeks.  Pap was normal  in April of 2006.  Quadruple screen was declined.  Hemoglobin upon entering  the practice was 12.  It was 11.9 at 28 weeks.  Group B strep culture was  negative at 36 weeks.  GC and Chlamydia cultures were negative at that time.  Glucola was normal at 123.  EDC of July 11, 2005 was established by last  menstrual period and was in agreement with ultrasound at approximately 7 and  18 weeks.   HISTORY OF PRESENT PREGNANCY:  The patient entered care at approximately 11  weeks.  She had some first trimester bleeding.  This was evaluated with an  ultrasound at 7 weeks.  She declined quadruple screen.  She had an  ultrasound at  18 weeks showing normal growth and development with limited  anatomy of the heart and the diaphragm.  A follow up ultrasound was done at  20 weeks showing normal growth and development and completed the anatomy.  She had another ultrasound at 28 weeks for size greater than dates.  Growth  was in the 54th percentile with normal fluid.  She had another ultrasound at  36 weeks showing growth at the 52nd percentile and normal fluid.  The rest  of her pregnancy has been essentially uncomplicated.   OBSTETRICAL HISTORY:  In 1994, she had a therapeutic termination of  pregnancy at 8 weeks.  She is a previous condom user.   PAST MEDICAL HISTORY:  1.  She has occasional yeast infections.  2.  She reports the usual childhood illnesses.  3.  She has a history of acid reflux, for which she uses Nexium.  4.  She had a UTI treated in April.  5.  She does  have a history of chronic tonsillitis.   SURGICAL HISTORY:  A small right toe removed bone.   ALLERGIES:  None.   FAMILY HISTORY:  Father had a heart attack x1.  Mother, father and paternal  aunt have chronic hypertension.  Her maternal aunt had a blood clot in her  leg.  Her mother had anemia.  Her maternal uncle, both grandmothers and  paternal uncle had diabetes.  Father and both brothers have asthma.  Paternal first cousin is bipolar and schizophrenic.  Paternal first cousin  does street drugs.  First cousins are alcoholic.  Paternal cousin is a  recovering alcohol.  Paternal aunt and uncle are smokers.   GENETIC HISTORY:  Unremarkable.  There is an unknown knowledge, however, of  the father of the baby's family history.   SOCIAL HISTORY:  The patient is single.  Father of the baby is involved and  supportive.  His name is Lyle Leisner.  The patient is African-American and  of the [place] faith.  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  Father of the baby has 2 years of college.  He is  employed at [employer].  She has been followed by the certified  nurse midwife service at [facility].  She denies any alcohol, drug  or tobacco use during this pregnancy.   PHYSICAL EXAMINATION:  VITAL SIGNS:  Stable.  The patient is afebrile.  HEENT:  Within normal limits.  LUNGS:  Breath sounds are clear.  HEART:  Regular rate and rhythm without murmur.  BREASTS:  Soft and nontender.  ABDOMEN:  Fundal height is approximately 40 cm.  This may be a bit deceptive  due to body habitus.  PELVIC:  Sterile speculum exam reveals positive pooling and positive fern.  The cervix is fingertip, 60%, vertex at a -2 station.  The patient is noted  to be leaking clear fluid.  Fetal heart rate is reactive with no  decelerations.  Uterine contractions every 5-7 minutes of mild quality.  EXTREMITIES:  Deep tendon reflexes are 2+ without clonus.  There is a trace  edema noted.   IMPRESSION:  1.  Intrauterine pregnancy at 33 and 1/7ths weeks.  2.  Spontaneous rupture of membranes in early labor.  3.  Negative group B strep.   PLAN:  1.  Admit to birthing suite for consult with Dr. Estanislado Pandy as attending      physician.  2.  Routine certified nurse midwife orders.  3.  Options reviewed with the patient for observation at present or      initiation of Pitocin, as well as initiation of Pitocin as time passes.      The patient at this point wishes to defer Pitocin as long as possible.      The patient will be allowed to walk and observation will continue.      Renaldo Reel Emilee Hero, C.N.M.      Crist Fat Rivard, M.D.  Electronically Signed    VLL/MEDQ  D:  07/04/2005  T:  07/04/2005  Job:  [id number]

## 2010-09-01 NOTE — H&P (Signed)
NAMEKIRSTON, Ward             ACCOUNT NO.:  [id number]   MEDICAL RECORD NO.:  [id number]           PATIENT TYPE:   LOCATION:                                [facility]ILITY:  [facility]   PHYSICIAN:  Crist Fat. Rivard, M.D. DATE OF BIRTH:  1976-01-16   DATE OF ADMISSION:  05/24/2006  DATE OF DISCHARGE:                              HISTORY & PHYSICAL   [name] is a 35 year old gravida 3, para 1-0-1-1, who presents today  at 10 weeks by dates but 7 weeks by ultrasound with an empty gestational  sac noted on ultrasound on May 23, 2006.  No fetal pole and no yoke  sac were noted.  There was a small subchorionic hemorrhage noted, 2.7 cm  by 2.1 cm by 3.1 cm.  The patient was seen in the office for her new OB  visit on May 23, 2006, at which time an embryonic pregnancy was  noted.  Options were reviewed with the patient including observation,  Cytotec for medical management of SAB, and D&E were reviewed.  The  patient has elected to proceed with D&E.  This is to be performed today  by Dr. Estanislado Pandy.   Labs revealed blood type AB positive.  CBC will be done on the day of  surgery.  A sickle cell test was negative from a previous pregnancy.  GC  and Chlamydia cultures were done along with Pap on May 23, 2006, and  are now pending.  Other prenatal labs including Rubella titer positive,  hepatitis B surface antigen, Rubella, and HIV are also currently pending  from February 7.   HISTORY OF PRESENT PREGNANCY:  The patient was seen in the office on  February 7 for her new OB visit.  She denied any bleeding, cramping, or  any other problems.  Fetal heart tones were unable to be auscultated.  An ultrasound was performed that showed a 7 week 1 day embryonic  pregnancy with an empty gestational sac without a yoke sac or fetal  pole.  An area of subchorionic hemorrhage was noted that was 2.7 by 2.1  by 3.1 cm.  The cervix was noted to be 5.22 cm long.  The right ovary  and left ovary were seen  without difficulties.   Options for management were reviewed with the patient.  She then elected  to proceed with D&E.  Dr. Pennie Rushing was consulted as the on call position  on February 7 and a scheduling plan was made for today at 8:45 with Dr.  Estanislado Pandy.   OBSTETRICAL HISTORY:  In March 2007, the patient had a vaginal birth of  a female infant, weight 6 pounds 15 ounces, at [facility].  During  that pregnancy, it was remarkable for elevated BMI, first trimester  spotting, history of gastroesophageal reflux disease with the patient on  Nexium, history of frequent tonsillitis, negative group B Strep.  She  had spontaneous rupture of membranes early that morning.  She then had  augmentation performed and progressed well to a vaginal delivery later  that day.  In 1994, she had a therapeutic termination of pregnancy at 8  weeks.   PAST MEDICAL HISTORY:  She has had occasional yeast infections.  She  reports the usual childhood illnesses.  She has a history of acid reflux  for which she uses Nexium.  She had a UTI in the past.  She does have a  history of chronic tonsillitis but had her tonsils removed in 2007.  She  also had a UTI just after finding out she was pregnant this time.   SURGICAL HISTORY:  The previously noted tonsillectomy last year.  She  also had a bone removed from one of her right toes.   ALLERGIES:  She has no known drug allergies.   FAMILY HISTORY:  Her father had a heart attack.  Her mother, father, and  maternal aunt have chronic hypertension.  Her maternal aunt had a blood  clot in her leg.  Her mother had anemia.  Her maternal uncle, both  grandmother's and paternal uncle had diabetes.  Father and brother both  have asthma.  A paternal first cousin is bipolar and schizophrenic.  A  paternal first cousin does street drugs.  First cousins are alcoholics.  Paternal cousin is a recovering alcohol.  Paternal aunt and uncle are  smokers.   GENETIC HISTORY:   Unremarkable. There is an unknown knowledge, however,  of the patient's partner's family history.   SOCIAL HISTORY:  The patient is single, her partner is involved and  supportive, his name is Lauren Ward.  The patient is [place]  of the [place] faith.  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  The father of the baby has two years of college.  He is employed at [employer].  The patient was followed  during her last pregnancy by the certified nurse midwife service.  She  denies any alcohol, drug, or tobacco use during this pregnancy.   REVIEW OF SYSTEMS:  The patient denies any bleeding, cramping, headache,  or any other problems.   PHYSICAL EXAMINATION:  VITAL SIGNS:  Stable, the patient is afebrile.  HEENT:  Within normal limits.  LUNGS:  Breath sounds are clear.  HEART:  Regular rate and rhythm without murmur.  BREASTS:  Soft, nontender.  ABDOMEN:  Fundal height measures approximately 7-[redacted] weeks along, she is  nontender.  PELVIC:  Unremarkable.  Cervix is closed.  EXTREMITIES:  Deep tendon reflexes are 2+ without clonus, there is trace  edema noted.   ASSESSMENT:  1. Empty gestational sac at 7 weeks size, 10 weeks by dates,      representing an embryonic pregnancy.  2. Rh positive blood type.   PLAN:  1. Admit to the [facility] per consult with Dr.      Dois Davenport Rivard at the attending physician.  2. Routine physician preoperative orders.  3. Risks and benefits of D&E were reviewed with the patient including      bleeding, infection, damage to other organs.  4. The patient will present to [facility] at 6:30 a.m. May 24, 2006, in preparation for the 8:45 a.m. case.  She will be n.p.o.      after midnight tonight.  Issues and questions were reviewed with      the patient regarding the procedure.  She is advised to bring      someone with her to provide transportation home. 5. Support was also  offered to the patient for her loss.     Renaldo Reel Emilee Hero,  C.N.M.      Crist Fat Rivard, M.D.  Electronically Signed   VLL/MEDQ  D:  05/23/2006  T:  05/23/2006  Job:  [id number]

## 2010-09-01 NOTE — Assessment & Plan Note (Signed)
[facility]                         GASTROENTEROLOGY OFFICE NOTE   [name], [name]                    MRN:          [id number]  DATE:06/20/2006                            DOB:          20-Oct-1975    HISTORY OF PRESENT ILLNESS:  Lauren Ward presents today for follow up.  She  is a 35 year old with a history of gastroesophageal reflux disease  complicated by peptic stricture.  She has undergone prior upper  endoscopy.  She was last evaluated December 26, 2004.  See that  dictation for details.  For her reflux disease, she is maintained on  Nexium 40 mg daily.  On Nexium, she has no heartburn or indigestion,  prior pharyngeal symptoms have resolved.  No dysphagia.  No medication  intolerance.  She requests medication refill.   CURRENT MEDICATIONS:  Nexium.   ALLERGIES:  No known drug allergies.   PHYSICAL EXAMINATION:  GENERAL:  Well-appearing female in no acute  distress.  VITAL SIGNS:  Blood pressure 106/68, heart rate 96, weight 201.4 pounds.  HEENT:  Sclerae anicteric.  Conjunctivae pink.  LUNGS:  Clear.  HEART:  Regular.  ABDOMEN:  Soft without tenderness, mass or hernia.   IMPRESSION:  Gastroesophageal reflux disease.  The patient remains  asymptomatic on Nexium.   RECOMMENDATIONS:  1. Reflux precautions with attention to weight loss.  2. Continue Nexium 40 mg daily.  Prescription with multiple refills      has been provided.  3. Office follow up in one year unless interval questions or problems.     Lauren Ward. Lauren Goodell, MD  Electronically Signed    JNP/MedQ  DD: 06/20/2006  DT: 06/20/2006  Job #: [id number]

## 2011-01-17 LAB — WET PREP, GENITAL: Trich, Wet Prep: NONE SEEN

## 2011-03-26 ENCOUNTER — Encounter: Payer: Self-pay | Admitting: Internal Medicine

## 2011-04-14 ENCOUNTER — Emergency Department (INDEPENDENT_AMBULATORY_CARE_PROVIDER_SITE_OTHER)
Admission: EM | Admit: 2011-04-14 | Discharge: 2011-04-14 | Disposition: A | Discharge status: Home / Self Care | Payer: BC Managed Care – HMO | Source: Home / Self Care | Attending: Emergency Medicine | Admitting: Emergency Medicine

## 2011-04-14 DIAGNOSIS — J069 Acute upper respiratory infection, unspecified: Secondary | ICD-10-CM

## 2011-04-14 MED ORDER — PSEUDOEPHEDRINE-GUAIFENESIN ER 120-1200 MG PO TB12: 1.0000 | ORAL_TABLET | Freq: Two times a day (BID) | ORAL | Status: DC | PRN

## 2011-04-14 MED ORDER — FLUTICASONE PROPIONATE 50 MCG/ACT NA SUSP: 2.0000 | Freq: Every day | NASAL | Status: DC

## 2011-04-14 NOTE — ED Provider Notes (Signed)
History     CSN: [id number]  Arrival date & time 04/14/11  4098   First MD Initiated Contact with Patient 04/14/11 1047      Chief Complaint  Patient presents with  . URI    HPI Comments: Pt with rhinorrhea, postnasal drip, sore, irritated throat, nonproductive cough, ear "fullness"x 2 weeks. no measured fevers at home. No itchy watery eyes, sneezing. No  HA, sinus pain/pressure, difficulty swallowing, fatigue,  bodyaches, abd pain, wheeze, SOB, abd pain, rash, N/V, change in hearing. No change in appetite. Taking mucinex with some relief in nasal congestion. Also taking nonsedating antihistamine.        Patient is a 35 y.o. female presenting with URI. The history is provided by the patient.  URI The primary symptoms include sore throat and cough. Primary symptoms do not include fever, fatigue, ear pain, wheezing, nausea, vomiting, myalgias or rash.  The sore throat is not accompanied by trouble swallowing.  Symptoms associated with the illness include congestion and rhinorrhea. The illness is not associated with sinus pressure.    Past Medical History  Diagnosis Date  . Acid reflux     Past Surgical History  Procedure Date  . Cholecystectomy   . Tonsillectomy   . Breast reduction surgery   . Cesarean section   . Tubal ligation     History reviewed. No pertinent family history.  History  Substance Use Topics  . Smoking status: Never Smoker   . Smokeless tobacco: Not on file  . Alcohol Use: Yes    OB History    Grav Para Term Preterm Abortions TAB SAB Ect Mult Living                  Review of Systems  Constitutional: Negative for fever and fatigue.  HENT: Positive for hearing loss, congestion, sore throat, rhinorrhea and postnasal drip. Negative for ear pain, nosebleeds, sneezing, trouble swallowing, neck pain, voice change, sinus pressure and tinnitus.   Eyes: Negative for itching.  Respiratory: Positive for cough. Negative for chest tightness, shortness  of breath and wheezing.   Cardiovascular: Negative for chest pain.  Gastrointestinal: Negative for nausea and vomiting.  Musculoskeletal: Negative for myalgias.  Skin: Negative for rash.  Neurological: Negative for weakness.    Allergies  Review of patient's allergies indicates no known allergies.  Home Medications   Current Outpatient Rx  Name Route Sig Dispense Refill  . OMEPRAZOLE 20 MG PO CPDR Oral Take 20 mg by mouth daily.      Marland Kitchen FLUTICASONE PROPIONATE 50 MCG/ACT NA SUSP Nasal Place 2 sprays into the nose daily. 16 g 0  . PSEUDOEPHEDRINE-GUAIFENESIN [phone] MG PO TB12 Oral Take 1 tablet by mouth 2 (two) times daily as needed (congestion). 20 each 0    BP 129/86  Pulse 66  Temp(Src) 98.5 F (36.9 C) (Oral)  Resp 16  SpO2 100%  LMP 03/17/2011  Physical Exam  Nursing note and vitals reviewed. Constitutional: She is oriented to person, place, and time. She appears well-developed and well-nourished.  HENT:  Head: Normocephalic and atraumatic.  Right Ear: Tympanic membrane normal.  Left Ear: Tympanic membrane normal.  Nose: Mucosal edema and rhinorrhea present. No epistaxis.  Mouth/Throat: Uvula is midline and mucous membranes are normal. Posterior oropharyngeal erythema present. No oropharyngeal exudate.       Swollen, irritated friable nasal mucosa R>L. Clear rhinorrhea. (-) frontal, maxillary sinus tenderness. cobblestoned pharynx. Tonsils surgically absent.  Eyes: Conjunctivae and EOM are normal. Pupils are equal, round,  and reactive to light.  Neck: Normal range of motion. Neck supple.  Cardiovascular: Normal rate, regular rhythm and normal heart sounds.   Pulmonary/Chest: Effort normal and breath sounds normal. No respiratory distress. She has no wheezes. She has no rales.  Abdominal: She exhibits no distension. There is no tenderness. There is no rebound and no guarding.  Musculoskeletal: Normal range of motion.  Lymphadenopathy:    She has no cervical adenopathy.   Neurological: She is alert and oriented to person, place, and time.  Skin: Skin is warm and dry. No rash noted.  Psychiatric: She has a normal mood and affect. Her behavior is normal. Judgment and thought content normal.    ED Course  Procedures (including critical care time)  Labs Reviewed - No data to display No results found.   1. URI (upper respiratory infection)       MDM    Luiz Blare, MD 04/14/11 1113

## 2011-04-14 NOTE — ED Notes (Signed)
Pt has sorethroat, stuffy nose, non-productive cough and ears are stopped up.

## 2011-07-17 ENCOUNTER — Telehealth: Payer: Self-pay | Admitting: Internal Medicine

## 2011-07-18 ENCOUNTER — Other Ambulatory Visit: Payer: Self-pay

## 2011-07-18 MED ORDER — OMEPRAZOLE 20 MG PO CPDR: 20.0000 mg | DELAYED_RELEASE_CAPSULE | Freq: Every day | ORAL | Status: DC

## 2011-07-19 ENCOUNTER — Other Ambulatory Visit: Payer: Self-pay

## 2011-07-19 MED ORDER — OMEPRAZOLE 20 MG PO CPDR: 20.0000 mg | DELAYED_RELEASE_CAPSULE | Freq: Every day | ORAL | Status: DC

## 2011-07-25 ENCOUNTER — Other Ambulatory Visit: Payer: Self-pay

## 2011-07-25 MED ORDER — OMEPRAZOLE 20 MG PO CPDR: 20.0000 mg | DELAYED_RELEASE_CAPSULE | Freq: Every day | ORAL | Status: DC

## 2011-08-09 NOTE — Telephone Encounter (Signed)
See note dated 07-17-11

## 2011-08-13 ENCOUNTER — Encounter: Payer: Self-pay | Admitting: Internal Medicine

## 2011-08-13 ENCOUNTER — Ambulatory Visit (INDEPENDENT_AMBULATORY_CARE_PROVIDER_SITE_OTHER): Payer: BC Managed Care – HMO | Admitting: Internal Medicine

## 2011-08-13 VITALS — BP 104/64 | HR 72 | Ht 62.0 in | Wt 237.2 lb

## 2011-08-13 DIAGNOSIS — K222 Esophageal obstruction: Secondary | ICD-10-CM

## 2011-08-13 DIAGNOSIS — K219 Gastro-esophageal reflux disease without esophagitis: Secondary | ICD-10-CM

## 2011-08-13 MED ORDER — OMEPRAZOLE 20 MG PO CPDR: 20.0000 mg | DELAYED_RELEASE_CAPSULE | Freq: Every day | ORAL | Status: AC

## 2011-08-13 MED ORDER — OMEPRAZOLE 20 MG PO CPDR: 20.0000 mg | DELAYED_RELEASE_CAPSULE | Freq: Every day | ORAL | Status: DC

## 2011-08-13 NOTE — Patient Instructions (Signed)
We have sent the following medications to your pharmacy for you to pick up at your convenience:  Omeprazole  

## 2011-08-13 NOTE — Progress Notes (Signed)
HISTORY OF PRESENT ILLNESS:  Lauren Ward is a 36 y.o. female with morbid obesity, GERD, and peptic stricture. She presents today for routine followup regarding management of GERD. She was last evaluated October 2011. She continues on omeprazole 20 mg daily. On medication, no reflux symptoms except for rare occasions with dietary indiscretion. For these occasions she takes antacids. No problems with dysphagia, abdominal pain, or other issues. No lower GI complaints. Overall medical health stable. Last endoscopy 2003.  REVIEW OF SYSTEMS:  All non-GI ROS negative except for sinus an allergy trouble and menstrual pain  Past Medical History  Diagnosis Date  . Esophageal stricture   . Hiatal hernia   . Peptic stricture of esophagus   . GERD (gastroesophageal reflux disease)     Past Surgical History  Procedure Date  . Cholecystectomy   . Tonsillectomy   . Breast reduction surgery   . Cesarean section   . Tubal ligation     Social History Lauren Ward  reports that she has never smoked. She has never used smokeless tobacco. She reports that she drinks alcohol. She reports that she does not use illicit drugs.  family history includes Diabetes in her maternal grandmother and paternal grandmother and Hypertension in her father and mother.  No Known Allergies     PHYSICAL EXAMINATION: Vital signs: BP 104/64  Pulse 72  Ht 5\' 2"  (1.575 m)  Wt 237 lb 4 oz (107.616 kg)  BMI 43.39 kg/m2  LMP 08/12/2011 General: Well-developed, well-nourished, no acute distress HEENT: Sclerae are anicteric, conjunctiva pink. Oral mucosa intact Lungs: Clear Heart: Regular Abdomen: soft,obese,nontender, nondistended, no obvious ascites, no peritoneal signs, normal bowel sounds. No organomegaly. Extremities: No edema Psychiatric: alert and oriented x3. Cooperative     ASSESSMENT:  #1. GERD with peptic stricture. Currently asymptomatic on PPI   PLAN:  #1. Reflux precautions with  attention to weight loss #2. Continue PPI #3. Routine followup in 1-2 years. Sooner if needed for clinical issues

## 2012-01-07 ENCOUNTER — Telehealth: Payer: Self-pay | Admitting: Obstetrics and Gynecology

## 2012-01-07 NOTE — Telephone Encounter (Signed)
Triage/general quest. 

## 2012-01-08 ENCOUNTER — Telehealth: Payer: Self-pay

## 2012-01-08 NOTE — Telephone Encounter (Signed)
Pt called to find out her blood type. Advised pt that her blood type is AB Positive, pt voiced understanding.

## 2012-01-08 NOTE — Telephone Encounter (Signed)
Lm on vm for pt to call back.

## 2012-03-13 ENCOUNTER — Other Ambulatory Visit: Payer: Self-pay | Admitting: Internal Medicine

## 2012-09-11 ENCOUNTER — Other Ambulatory Visit: Payer: Self-pay | Admitting: Internal Medicine

## 2013-01-27 ENCOUNTER — Emergency Department (HOSPITAL_COMMUNITY)
Admission: EM | Admit: 2013-01-27 | Discharge: 2013-01-27 | Disposition: A | Discharge status: Home / Self Care | Payer: BC Managed Care – PPO | Attending: Emergency Medicine | Admitting: Emergency Medicine

## 2013-01-27 ENCOUNTER — Emergency Department (HOSPITAL_COMMUNITY): Payer: BC Managed Care – PPO

## 2013-01-27 ENCOUNTER — Encounter (HOSPITAL_COMMUNITY): Payer: Self-pay | Admitting: Emergency Medicine

## 2013-01-27 DIAGNOSIS — S161XXD Strain of muscle, fascia and tendon at neck level, subsequent encounter: Secondary | ICD-10-CM

## 2013-01-27 DIAGNOSIS — G44309 Post-traumatic headache, unspecified, not intractable: Secondary | ICD-10-CM

## 2013-01-27 DIAGNOSIS — K219 Gastro-esophageal reflux disease without esophagitis: Secondary | ICD-10-CM | POA: Insufficient documentation

## 2013-01-27 DIAGNOSIS — M542 Cervicalgia: Secondary | ICD-10-CM | POA: Insufficient documentation

## 2013-01-27 DIAGNOSIS — R51 Headache: Secondary | ICD-10-CM | POA: Insufficient documentation

## 2013-01-27 DIAGNOSIS — G8911 Acute pain due to trauma: Secondary | ICD-10-CM | POA: Insufficient documentation

## 2013-01-27 DIAGNOSIS — Z79899 Other long term (current) drug therapy: Secondary | ICD-10-CM | POA: Insufficient documentation

## 2013-01-27 MED ORDER — METHOCARBAMOL 500 MG PO TABS: 500.0000 mg | ORAL_TABLET | Freq: Two times a day (BID) | ORAL | Status: DC
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
## 2013-01-27 NOTE — ED Provider Notes (Signed)
Medical screening examination/treatment/procedure(s) were performed by non-physician practitioner and as supervising physician I was immediately available for consultation/collaboration.   Noelie Renfrow, MD 01/27/13 0614 
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
## 2013-03-24 ENCOUNTER — Other Ambulatory Visit: Payer: Self-pay | Admitting: Internal Medicine

## 2013-09-19 ENCOUNTER — Other Ambulatory Visit: Payer: Self-pay | Admitting: Internal Medicine

## 2013-12-24 ENCOUNTER — Other Ambulatory Visit: Payer: Self-pay | Admitting: Internal Medicine

## 2014-09-09 ENCOUNTER — Other Ambulatory Visit: Payer: Self-pay

## 2014-09-09 DIAGNOSIS — Z1231 Encounter for screening mammogram for malignant neoplasm of breast: Secondary | ICD-10-CM

## 2014-10-11 ENCOUNTER — Ambulatory Visit
Admission: RE | Admit: 2014-10-11 | Discharge: 2014-10-11 | Disposition: A | Discharge status: Home / Self Care | Payer: BLUE CROSS/BLUE SHIELD | Source: Ambulatory Visit

## 2014-10-11 DIAGNOSIS — Z1231 Encounter for screening mammogram for malignant neoplasm of breast: Secondary | ICD-10-CM

## 2014-11-15 ENCOUNTER — Other Ambulatory Visit: Payer: Self-pay | Admitting: Obstetrics and Gynecology

## 2014-11-15 ENCOUNTER — Other Ambulatory Visit (HOSPITAL_COMMUNITY)
Admission: RE | Admit: 2014-11-15 | Discharge: 2014-11-15 | Disposition: A | Discharge status: Home / Self Care | Payer: BLUE CROSS/BLUE SHIELD | Source: Ambulatory Visit | Attending: Obstetrics and Gynecology | Admitting: Obstetrics and Gynecology

## 2014-11-15 DIAGNOSIS — Z1151 Encounter for screening for human papillomavirus (HPV): Secondary | ICD-10-CM | POA: Insufficient documentation

## 2014-11-15 DIAGNOSIS — Z01419 Encounter for gynecological examination (general) (routine) without abnormal findings: Secondary | ICD-10-CM | POA: Insufficient documentation

## 2014-11-17 LAB — CYTOLOGY - PAP

## 2015-03-28 ENCOUNTER — Other Ambulatory Visit: Payer: Self-pay | Admitting: Obstetrics and Gynecology

## 2015-03-28 DIAGNOSIS — D259 Leiomyoma of uterus, unspecified: Secondary | ICD-10-CM

## 2015-04-21 ENCOUNTER — Other Ambulatory Visit: Payer: Self-pay | Admitting: Obstetrics and Gynecology

## 2015-04-21 ENCOUNTER — Ambulatory Visit
Admission: RE | Admit: 2015-04-21 | Discharge: 2015-04-21 | Disposition: A | Discharge status: Home / Self Care | Payer: BLUE CROSS/BLUE SHIELD | Source: Ambulatory Visit | Attending: Obstetrics and Gynecology | Admitting: Obstetrics and Gynecology

## 2015-04-21 ENCOUNTER — Other Ambulatory Visit (HOSPITAL_COMMUNITY): Payer: Self-pay | Admitting: Interventional Radiology

## 2015-04-21 DIAGNOSIS — D259 Leiomyoma of uterus, unspecified: Secondary | ICD-10-CM

## 2015-04-21 NOTE — Consult Note (Signed)
Chief Complaint: Patient was seen in consultation today for  Chief Complaint  Patient presents with  . Advice Only    Consult for [place]     at the request of [place]  Referring Physician(s): Varnado,Evelyn  History of Present Illness: Lauren Ward is a 40 y.o. female with a one-year history of increasing menorrhagia. Prior to any medical therapy, her periods lasted 6 days with 3-4 heavy days. During that time, she had to change pads 1-2 times per day. She is gravida 2 and para 2. She has had a bilateral tubal ligation and has no future pregnancy plans. She denies perimenopausal symptoms, pelvic cancer, pelvic radiation, or PID. She has begun oral contraceptives and this does control her severe bleeding but she wishes to stop oral contraceptives and seeks definitive therapy for her fibroids. She has not had any other therapy for fibroids. She denies urinary symptoms or bulk symptoms. She does have some cramping, but menorrhagia is her primary complaint. There is no bleeding between periods. She denies anemia.  Past Medical History  Diagnosis Date  . Esophageal stricture   . Hiatal hernia   . Peptic stricture of esophagus   . GERD (gastroesophageal reflux disease)   . Fibroids     Past Surgical History  Procedure Laterality Date  . Cholecystectomy    . Tonsillectomy    . Breast reduction surgery    . Cesarean section    . Tubal ligation      Allergies: Review of patient's allergies indicates no known allergies.  Medications: Prior to Admission medications   Medication Sig Start Date End Date Taking? Authorizing Provider  CALCIUM-MAGNESIUM PO Take 3 tablets by mouth daily.   Yes Historical Provider, MD  cholecalciferol (VITAMIN D) 1000 UNITS tablet Take 1,000 Units by mouth daily.   Yes Historical Provider, MD  Multiple Vitamin (MULTIVITAMIN) tablet Take 1 tablet by mouth daily.   Yes Historical Provider, MD  omeprazole (PRILOSEC) 20 MG capsule Take 1 capsule  (20 mg total) by mouth daily. 08/13/11  Yes Irene Shipper, MD  PRESCRIPTION MEDICATION Take 1 tablet by mouth daily. Birth control unknown name   Yes Historical Provider, MD  cephALEXin (KEFLEX) 500 MG capsule Take 500 mg by mouth 4 (four) times daily. Reported on 04/21/2015 01/22/13   Historical Provider, MD  cyclobenzaprine (FLEXERIL) 10 MG tablet Take 10 mg by mouth 3 (three) times daily as needed for muscle spasms (muscle aches). Reported on 04/21/2015    Historical Provider, MD  methocarbamol (ROBAXIN) 500 MG tablet Take 1 tablet (500 mg total) by mouth 2 (two) times daily. Patient not taking: Reported on 04/21/2015 01/27/13   Junius Creamer, NP  omeprazole (PRILOSEC) 20 MG capsule TAKE 1 CAPSULE DAILY    Irene Shipper, MD  oxyCODONE-acetaminophen (PERCOCET/ROXICET) 5-325 MG per tablet Take 1 tablet by mouth every 6 (six) hours as needed for pain (pain). Reported on 04/21/2015    Historical Provider, MD     Family History  Problem Relation Age of Onset  . Diabetes Maternal Grandmother   . Diabetes Paternal Grandmother   . Hypertension Mother   . Hypertension Father     Social History   Social History  . Marital Status: Married    Spouse Name: N/A  . Number of Children: 2  . Years of Education: N/A   Occupational History  . customer service    Social History Main Topics  . Smoking status: Never Smoker   . Smokeless tobacco:  Never Used  . Alcohol Use: No     Comment: ocass  . Drug Use: No  . Sexual Activity: Yes    Birth Control/ Protection: Surgical   Other Topics Concern  . Not on file   Social History Narrative      Review of Systems: A 12 point ROS discussed and pertinent positives are indicated in the HPI above.  All other systems are negative.  Review of Systems  Vital Signs: BP 123/78 mmHg  Pulse 85  Temp(Src) 98.4 F (36.9 C) (Oral)  Resp 14  Ht 5\' 2"  (1.575 m)  Wt 261 lb (118.389 kg)  BMI 47.73 kg/m2  SpO2 100%  LMP 04/21/2015 (Exact Date)  Physical Exam    Constitutional: She is oriented to person, place, and time. She appears well-developed and well-nourished.  Cardiovascular: Normal rate and regular rhythm.   Pulmonary/Chest: Effort normal and breath sounds normal.  Musculoskeletal:  B Fem/DP/PT pulses are 2+  Neurological: She is alert and oriented to person, place, and time.    Mallampati Score:   2  Imaging: No results found.  An outside ultrasound describes a single submucosal fibroid measuring 4.2 cm.  Labs:  CBC: No results for input(s): WBC, HGB, HCT, PLT in the last 8760 hours.  COAGS: No results for input(s): INR, APTT in the last 8760 hours.  BMP: No results for input(s): NA, K, CL, CO2, GLUCOSE, BUN, CALCIUM, CREATININE, GFRNONAA, GFRAA in the last 8760 hours.  Invalid input(s): CMP  LIVER FUNCTION TESTS: No results for input(s): BILITOT, AST, ALT, ALKPHOS, PROT, ALBUMIN in the last 8760 hours.  TUMOR MARKERS: No results for input(s): AFPTM, CEA, CA199, CHROMGRNA in the last 8760 hours.  Assessment and Plan:  Ms. Marchionda apparently has a symptomatic uterine fibroid resulting in severe menorrhagia without anemia. She has no intermenstrual bleeding and his young therefore endometrial biopsy is not necessary. An MRI will be required. I discussed the procedure of uterine fibroid embolization. I discussed the risks, benefits, and alternatives. Her questions were answered.  Thank you for this interesting consult.  I greatly enjoyed meeting KEERTI ROCKER and look forward to participating in their care.  A copy of this report was sent to the requesting provider on this date.  Signed: Sammi Stolarz, ART A 04/21/2015, 12:17 PM   I spent a total of  40 Minutes  in face to face in clinical consultation, greater than 50% of which was counseling/coordinating care for menorrhagia and uterine fibroids.

## 2015-05-02 ENCOUNTER — Ambulatory Visit
Admission: RE | Admit: 2015-05-02 | Discharge: 2015-05-02 | Disposition: A | Discharge status: Home / Self Care | Payer: BLUE CROSS/BLUE SHIELD | Source: Ambulatory Visit | Attending: Obstetrics and Gynecology | Admitting: Obstetrics and Gynecology

## 2015-05-02 DIAGNOSIS — D259 Leiomyoma of uterus, unspecified: Secondary | ICD-10-CM

## 2015-05-02 MED ORDER — GADOBENATE DIMEGLUMINE 529 MG/ML IV SOLN: 20.0000 mL | Freq: Once | INTRAVENOUS | Status: DC | PRN

## 2015-05-07 ENCOUNTER — Emergency Department (HOSPITAL_COMMUNITY): Payer: BLUE CROSS/BLUE SHIELD

## 2015-05-07 ENCOUNTER — Emergency Department (HOSPITAL_COMMUNITY)
Admission: EM | Admit: 2015-05-07 | Discharge: 2015-05-07 | Disposition: A | Discharge status: Home / Self Care | Payer: BLUE CROSS/BLUE SHIELD | Attending: Emergency Medicine | Admitting: Emergency Medicine

## 2015-05-07 ENCOUNTER — Encounter (HOSPITAL_COMMUNITY): Payer: Self-pay | Admitting: Emergency Medicine

## 2015-05-07 DIAGNOSIS — Z9049 Acquired absence of other specified parts of digestive tract: Secondary | ICD-10-CM | POA: Insufficient documentation

## 2015-05-07 DIAGNOSIS — Z79899 Other long term (current) drug therapy: Secondary | ICD-10-CM | POA: Insufficient documentation

## 2015-05-07 DIAGNOSIS — Z9851 Tubal ligation status: Secondary | ICD-10-CM | POA: Diagnosis not present

## 2015-05-07 DIAGNOSIS — R109 Unspecified abdominal pain: Secondary | ICD-10-CM | POA: Diagnosis present

## 2015-05-07 DIAGNOSIS — Z79818 Long term (current) use of other agents affecting estrogen receptors and estrogen levels: Secondary | ICD-10-CM | POA: Insufficient documentation

## 2015-05-07 DIAGNOSIS — Z86018 Personal history of other benign neoplasm: Secondary | ICD-10-CM | POA: Diagnosis not present

## 2015-05-07 DIAGNOSIS — K219 Gastro-esophageal reflux disease without esophagitis: Secondary | ICD-10-CM | POA: Diagnosis not present

## 2015-05-07 DIAGNOSIS — Z3202 Encounter for pregnancy test, result negative: Secondary | ICD-10-CM | POA: Diagnosis not present

## 2015-05-07 LAB — I-STAT BETA HCG BLOOD, ED (MC, WL, AP ONLY)

## 2015-05-07 LAB — COMPREHENSIVE METABOLIC PANEL
ALK PHOS: 87 U/L (ref 38–126)
ALT: 14 U/L (ref 14–54)
ANION GAP: 8 (ref 5–15)
AST: 15 U/L (ref 15–41)
Albumin: 3.1 g/dL — ABNORMAL LOW (ref 3.5–5.0)
BILIRUBIN TOTAL: 0.5 mg/dL (ref 0.3–1.2)
BUN: 8 mg/dL (ref 6–20)
CO2: 26 mmol/L (ref 22–32)
Calcium: 8.7 mg/dL — ABNORMAL LOW (ref 8.9–10.3)
Chloride: 106 mmol/L (ref 101–111)
Creatinine, Ser: 0.94 mg/dL (ref 0.44–1.00)
GFR calc Af Amer: >60 mL/min (ref >60)
GLUCOSE: 125 mg/dL — AB (ref 65–99)
POTASSIUM: 3.7 mmol/L (ref 3.5–5.1)
Sodium: 140 mmol/L (ref 135–145)
TOTAL PROTEIN: 7.5 g/dL (ref 6.5–8.1)

## 2015-05-07 LAB — URINE MICROSCOPIC-ADD ON
BACTERIA UA: NONE SEEN
WBC, UA: NONE SEEN WBC/hpf (ref 0–5)

## 2015-05-07 LAB — URINALYSIS, ROUTINE W REFLEX MICROSCOPIC
BILIRUBIN URINE: NEGATIVE
Glucose, UA: NEGATIVE mg/dL
KETONES UR: NEGATIVE mg/dL
LEUKOCYTES UA: NEGATIVE
NITRITE: NEGATIVE
PH: 6.5 (ref 5.0–8.0)
PROTEIN: NEGATIVE mg/dL
Specific Gravity, Urine: 1.005 (ref 1.005–1.030)

## 2015-05-07 LAB — LIPASE, BLOOD: Lipase: 22 U/L (ref 11–51)

## 2015-05-07 LAB — CBC
HEMATOCRIT: 36.2 % (ref 36.0–46.0)
Hemoglobin: 12.1 g/dL (ref 12.0–15.0)
MCH: 27.4 pg (ref 26.0–34.0)
MCHC: 33.4 g/dL (ref 30.0–36.0)
MCV: 82.1 fL (ref 78.0–100.0)
Platelets: 322 10*3/uL (ref 150–400)
RBC: 4.41 MIL/uL (ref 3.87–5.11)
RDW: 14.7 % (ref 11.5–15.5)
WBC: 12.2 10*3/uL — AB (ref 4.0–10.5)

## 2015-05-07 MED ORDER — IOHEXOL 300 MG/ML  SOLN
25.0000 mL | Freq: Once | INTRAMUSCULAR | Status: AC | PRN
  Administered 2015-05-07: 25 mL via ORAL

## 2015-05-07 MED ORDER — IOHEXOL 300 MG/ML  SOLN
100.0000 mL | Freq: Once | INTRAMUSCULAR | Status: AC | PRN
  Administered 2015-05-07: 100 mL via INTRAVENOUS

## 2015-05-07 MED ORDER — GI COCKTAIL ~~LOC~~
30.0000 mL | Freq: Once | ORAL | Status: AC
  Administered 2015-05-07: 30 mL via ORAL
  Filled 2015-05-07: qty 30

## 2015-05-07 NOTE — ED Notes (Signed)
Pt reports R side abd pain next to belly button for the last 2 days. No n/v/d, dysuria or vaginal discharge.

## 2015-05-07 NOTE — Discharge Instructions (Signed)

## 2015-05-07 NOTE — ED Provider Notes (Signed)
17:50- reevaluation post CT imaging  Medications  gi cocktail (Maalox,Lidocaine,Donnatal) (30 mLs Oral Given 05/07/15 1441)  iohexol (OMNIPAQUE) 300 MG/ML solution 25 mL (25 mLs Oral Contrast Given 05/07/15 1723)  iohexol (OMNIPAQUE) 300 MG/ML solution 100 mL (100 mLs Intravenous Contrast Given 05/07/15 1723)    Patient Vitals for the past 24 hrs:  BP Temp Temp src Pulse Resp SpO2  05/07/15 1430 121/69 mmHg - - 78 16 100 %  05/07/15 1257 151/83 mmHg 98.5 F (36.9 C) Oral 87 16 100 %    5:52 PM Reevaluation with update and discussion. After initial assessment and treatment, an updated evaluation reveals she is comfortable, states her pain has resolved. She has no additional complaints. Findings discussed with the patient, all questions were answered. Vertis Kelch, MD 05/07/15 1755

## 2015-05-07 NOTE — ED Provider Notes (Signed)
CSN: [id number]     Arrival date & time 05/07/15  1233 History   First MD Initiated Contact with Patient 05/07/15 1329     Chief Complaint  Patient presents with  . Abdominal Pain     (Consider location/radiation/quality/duration/timing/severity/associated sxs/prior Treatment) HPI  40 year old female comes today complaining of right-sided abdominal pain for 2 days. She states the pain has been essentially constant. She has been able to eat and drink without difficulty. She has not had nausea, vomiting, diarrhea, urinary frequency, or vaginal discharge. She is on birth control pills and reports normal menstrual cycles.   She has a history of esophageal stricture, hiatal hernia, and noted in her records is cholecystectomy  Past Medical History  Diagnosis Date  . Esophageal stricture   . Hiatal hernia   . Peptic stricture of esophagus   . GERD (gastroesophageal reflux disease)   . Fibroids    Past Surgical History  Procedure Laterality Date  . Cholecystectomy    . Tonsillectomy    . Breast reduction surgery    . Cesarean section    . Tubal ligation     Family History  Problem Relation Age of Onset  . Diabetes Maternal Grandmother   . Diabetes Paternal Grandmother   . Hypertension Mother   . Hypertension Father    Social History  Substance Use Topics  . Smoking status: Never Smoker   . Smokeless tobacco: Never Used  . Alcohol Use: No     Comment: ocass   OB History    No data available     Review of Systems  All other systems reviewed and are negative.     Allergies  Review of patient's allergies indicates no known allergies.  Home Medications   Prior to Admission medications   Medication Sig Start Date End Date Taking? Authorizing Provider  Cholecalciferol (VITAMIN D) 2000 units tablet Take 2,000 Units by mouth daily.   Yes Historical Provider, MD  norethindrone-ethinyl estradiol (MICROGESTIN,JUNEL,LOESTRIN) 1-20 MG-MCG tablet Take 1 tablet by mouth daily.  04/15/15  Yes Historical Provider, MD  omeprazole (PRILOSEC) 20 MG capsule Take 1 capsule (20 mg total) by mouth daily. 08/13/11  Yes Irene Shipper, MD   BP 121/69 mmHg  Pulse 78  Temp(Src) 98.5 F (36.9 C) (Oral)  Resp 16  SpO2 100%  LMP 04/21/2015 (Exact Date) Physical Exam  Constitutional: She is oriented to person, place, and time. She appears well-developed and well-nourished.  Morbidly obese  HENT:  Head: Normocephalic and atraumatic.  Right Ear: External ear normal.  Left Ear: External ear normal.  Nose: Nose normal.  Mouth/Throat: Oropharynx is clear and moist.  Eyes: Conjunctivae and EOM are normal. Pupils are equal, round, and reactive to light.  Neck: Normal range of motion. Neck supple.  Cardiovascular: Normal rate, regular rhythm, normal heart sounds and intact distal pulses.   Pulmonary/Chest: Effort normal and breath sounds normal.  Abdominal: Soft. Bowel sounds are normal. There is tenderness.    Mild mid right-sided abdominal tenderness to palpation  Musculoskeletal: Normal range of motion.  Neurological: She is alert and oriented to person, place, and time. She has normal reflexes.  Skin: Skin is warm and dry.  Psychiatric: She has a normal mood and affect. Her behavior is normal. Judgment and thought content normal.  Nursing note and vitals reviewed.   ED Course  Procedures (including critical care time) Labs Review Labs Reviewed  COMPREHENSIVE METABOLIC PANEL - Abnormal; Notable for the following:    Glucose, Bld 125 (*)  Calcium 8.7 (*)    Albumin 3.1 (*)    All other components within normal limits  CBC - Abnormal; Notable for the following:    WBC 12.2 (*)    All other components within normal limits  URINALYSIS, ROUTINE W REFLEX MICROSCOPIC (NOT AT [facility]) - Abnormal; Notable for the following:    Hgb urine dipstick SMALL (*)    All other components within normal limits  URINE MICROSCOPIC-ADD ON - Abnormal; Notable for the following:    Squamous  Epithelial / LPF 0-5 (*)    All other components within normal limits  LIPASE, BLOOD  I-STAT BETA HCG BLOOD, ED ([facility], [facility], [facility] ONLY)    Imaging Review No results found. I have personally reviewed and evaluated these images and lab results as part of my medical decision-making.   EKG Interpretation None      MDM   Final diagnoses:  Abdominal pain, unspecified abdominal location    40 year old female with right-sided abdominal pain. Negative pregnancy test here with a mildly elevated white blood cell count plan CT of the abdomen to evaluate for appendicitis. Patient feels better after GI cocktail. Patient's care signed out to Dr. Eulis Foster and he is aware of pending CT    Pattricia Boss, MD 05/07/15 1541

## 2015-05-09 ENCOUNTER — Telehealth: Payer: Self-pay | Admitting: Interventional Radiology

## 2015-05-09 NOTE — Progress Notes (Signed)
I spoke with Lauren Ward regarding her MRI and diagnosis of adenomyosis. Uterine embolization is effective at a 50% efficacy level at best for this.  She was instructed to follow up with her gynecologist for further management.

## 2015-07-27 ENCOUNTER — Encounter (HOSPITAL_COMMUNITY): Payer: Self-pay

## 2015-07-27 ENCOUNTER — Encounter (HOSPITAL_COMMUNITY)
Admission: RE | Admit: 2015-07-27 | Discharge: 2015-07-27 | Disposition: A | Discharge status: Home / Self Care | Payer: BLUE CROSS/BLUE SHIELD | Source: Ambulatory Visit | Attending: Obstetrics and Gynecology | Admitting: Obstetrics and Gynecology

## 2015-07-27 DIAGNOSIS — N800 Endometriosis of uterus: Secondary | ICD-10-CM | POA: Insufficient documentation

## 2015-07-27 DIAGNOSIS — N946 Dysmenorrhea, unspecified: Secondary | ICD-10-CM | POA: Insufficient documentation

## 2015-07-27 DIAGNOSIS — Z01812 Encounter for preprocedural laboratory examination: Secondary | ICD-10-CM | POA: Insufficient documentation

## 2015-07-27 LAB — TYPE AND SCREEN
ABO/RH(D): AB POS
Antibody Screen: NEGATIVE

## 2015-07-27 LAB — CBC
HCT: 34.8 % — ABNORMAL LOW (ref 36.0–46.0)
HEMOGLOBIN: 11.8 g/dL — AB (ref 12.0–15.0)
MCH: 27.6 pg (ref 26.0–34.0)
MCHC: 33.9 g/dL (ref 30.0–36.0)
MCV: 81.3 fL (ref 78.0–100.0)
Platelets: 301 10*3/uL (ref 150–400)
RBC: 4.28 MIL/uL (ref 3.87–5.11)
RDW: 14.6 % (ref 11.5–15.5)
WBC: 11.6 10*3/uL — ABNORMAL HIGH (ref 4.0–10.5)

## 2015-07-27 NOTE — Patient Instructions (Signed)
Your procedure is scheduled on: August 10, 2015   Enter through the Main Entrance of [facility] at: 6:00 am   Pick up the phone at the desk and dial [phone].  Call this number if you have problems the morning of surgery: [phone].  Remember: Do NOT eat food: after midnight on Tuesday April 25th  Do NOT drink clear liquids after: midnight on Tuesday April 25th  Take these medicines the morning of surgery with a SIP OF WATER: prilosec   Do NOT wear jewelry (body piercing), metal hair clips/bobby pins, make-up, or nail polish. Do NOT wear lotions, powders, or perfumes.  You may wear deoderant. Do NOT shave for 48 hours prior to surgery. Do NOT bring valuables to the hospital. Contacts, dentures, or bridgework may not be worn into surgery. Leave suitcase in car.  After surgery it may be brought to your room.  For patients admitted to the hospital, checkout time is 11:00 AM the day of discharge.

## 2015-08-04 NOTE — Anesthesia Preprocedure Evaluation (Addendum)
Anesthesia Evaluation  Patient identified by MRN, date of birth, ID band Patient awake    Reviewed: Allergy & Precautions, NPO status , Patient's Chart, lab work & pertinent test results  Airway Mallampati: II   Neck ROM: Full    Dental  (+) Teeth Intact, Dental Advisory Given   Pulmonary neg pulmonary ROS,    breath sounds clear to auscultation       Cardiovascular negative cardio ROS   Rhythm:Regular     Neuro/Psych negative neurological ROS  negative psych ROS   GI/Hepatic Neg liver ROS, hiatal hernia, GERD  Medicated,  Endo/Other  negative endocrine ROSMorbid obesityBMI 48  Renal/GU negative Renal ROS  negative genitourinary   Musculoskeletal negative musculoskeletal ROS (+)   Abdominal   Peds negative pediatric ROS (+)  Hematology negative hematology ROS (+) 12/35   Anesthesia Other Findings   Reproductive/Obstetrics negative OB ROS                            Anesthesia Physical Anesthesia Plan  ASA: II  Anesthesia Plan: General   Post-op Pain Management:    Induction: Intravenous  Airway Management Planned: Oral ETT  Additional Equipment:   Intra-op Plan:   Post-operative Plan: Extubation in OR  Informed Consent: I have reviewed the patients History and Physical, chart, labs and discussed the procedure including the risks, benefits and alternatives for the proposed anesthesia with the patient or authorized representative who has indicated his/her understanding and acceptance.     Plan Discussed with:   Anesthesia Plan Comments: (Glide available, check am PG test, multimodal pain RX, recover head up, PG neg)       Anesthesia Quick Evaluation

## 2015-08-08 MED ORDER — DEXTROSE 5 % IV SOLN: 3.0000 g | INTRAVENOUS | Status: DC

## 2015-08-09 MED ORDER — DEXTROSE 5 % IV SOLN
3.0000 g | Freq: Once | INTRAVENOUS | Status: AC
  Administered 2015-08-10: 3 g via INTRAVENOUS
  Filled 2015-08-09: qty 3000

## 2015-08-09 NOTE — H&P (Addendum)
   History of Present Illness  General:  Pt presents for preop for LAVH/BS due to adenomyosis. Pt has previously used OCPs for menometrorrhagia. Had a h/o fibroids. Ultrasound was done that showed 4 cm fibroid. Pt opted for [place]. When MRI was performed it showed pt had adenomyosis. Due to this condition, pt desired to proceed with hysterectomy to treat heavy painful menses.   Current Medications  Taking   Omeprazole 20 MG Capsule Delayed Release 2 capsules Orally Once a day   Vitamin D 2000 UNIT Tablet 2 tablet Orally Once a day   Gildess 1/20(Norethindrone Acet-Ethinyl Est) 1-20 MG-MCG Tablet 1 tablet Orally Daily for Three Weeks, 1 Week off, Notes: hasn't started yet   Medication List reviewed and reconciled with the patient    Past Medical History  Acid reflux.   Migraine headaches.           Surgical History  toe surgery 01/1999  c-section 07/2008  BTL 07/2008  gallbladder removed 09/2008  breast reduction 03/2009   Family History  Father: alive, diagnosed with HTN  Mother: alive, diagnosed with HTN  denies any GYN family cancer hx, 2 brothers- in good health.   Social History  General:  Tobacco use  cigarettes: Never smoked Tobacco history last updated 07/28/2015 no EXPOSURE TO PASSIVE SMOKE.  Alcohol: yes, occasionally.  no Recreational drug use.  no Exercise.  Marital Status: married.  Children: girls, 2.    Gyn History  Sexual activity currently sexually active.  Periods : every month.  LMP 07/12/15.  Birth control BTL, ocp for bleeding.  Last pap smear date 11/2014, all negative.  Last mammogram date 08/2014.  Abnormal pap smear assessed with colposcopy, 2008, normal since.  Denies H/O STD.    OB History  Number of pregnancies 2.  Pregnancy # 1 live birth, vaginal delivery.  Pregnancy # 2 live birth, C-section/BTL.    Allergies  N.K.D.A.   Hospitalization/Major Diagnostic Procedure  see surgery    Review of Systems  Denies fever/chills, chest  pain, SOB, headaches, numbness/tingling. No h/o complication with anesthesia, bleeding disorders or blood clots.   Vital Signs  Wt 267, Ht 62, BMI 48.83, Pulse sitting 83, BP sitting 120/82.   Physical Examination  GENERAL:  Patient appears alert and oriented.  General Appearance: well-appearing, well-developed, no acute distress.  Speech: clear.  LUNGS:  Auscultation: no wheezing/rhonchi/rales. CTA bilaterally.  HEART:  Heart sounds: normal. RRR. no murmur.  ABDOMEN:  General: soft nontender, nondistended, no masses.  FEMALE GENITOURINARY:  Pelvic Not examined.  EXTREMITIES:  General: No edema or calf tenderness.     Assessments   1. Pre-operative clearance - Z01.818 (Primary)   2. Adenomyosis - N80.0   3. Dysmenorrhea - N94.6   Treatment  1. Pre-operative clearance  Notes: Proceed with LAVH/BS. Pt with h/o cesarean section, which can increase risk of injury especially to bladder. Pt counseled on risk of bleeding, infection and injury to internal organs. Also informed surgery may be converted to TAH if excessive scar tissue. All questions answered. Consent obtained.    Follow Up  2 Weeks post op

## 2015-08-10 ENCOUNTER — Ambulatory Visit (HOSPITAL_COMMUNITY): Payer: BLUE CROSS/BLUE SHIELD | Admitting: Anesthesiology

## 2015-08-10 ENCOUNTER — Ambulatory Visit (HOSPITAL_COMMUNITY)
Admission: RE | Admit: 2015-08-10 | Discharge: 2015-08-11 | Disposition: A | Discharge status: Home / Self Care | Payer: BLUE CROSS/BLUE SHIELD | Source: Ambulatory Visit | Attending: Obstetrics and Gynecology | Admitting: Obstetrics and Gynecology

## 2015-08-10 ENCOUNTER — Encounter (HOSPITAL_COMMUNITY)
Admission: RE | Disposition: A | Discharge status: Home / Self Care | Payer: Self-pay | Source: Ambulatory Visit | Attending: Obstetrics and Gynecology

## 2015-08-10 ENCOUNTER — Encounter (HOSPITAL_COMMUNITY): Payer: Self-pay | Admitting: *Deleted

## 2015-08-10 DIAGNOSIS — N888 Other specified noninflammatory disorders of cervix uteri: Secondary | ICD-10-CM | POA: Insufficient documentation

## 2015-08-10 DIAGNOSIS — K219 Gastro-esophageal reflux disease without esophagitis: Secondary | ICD-10-CM | POA: Insufficient documentation

## 2015-08-10 DIAGNOSIS — D251 Intramural leiomyoma of uterus: Secondary | ICD-10-CM | POA: Diagnosis not present

## 2015-08-10 DIAGNOSIS — N800 Endometriosis of uterus: Secondary | ICD-10-CM | POA: Diagnosis not present

## 2015-08-10 DIAGNOSIS — G43909 Migraine, unspecified, not intractable, without status migrainosus: Secondary | ICD-10-CM | POA: Diagnosis not present

## 2015-08-10 DIAGNOSIS — N920 Excessive and frequent menstruation with regular cycle: Secondary | ICD-10-CM | POA: Insufficient documentation

## 2015-08-10 DIAGNOSIS — N946 Dysmenorrhea, unspecified: Secondary | ICD-10-CM | POA: Insufficient documentation

## 2015-08-10 DIAGNOSIS — K449 Diaphragmatic hernia without obstruction or gangrene: Secondary | ICD-10-CM | POA: Insufficient documentation

## 2015-08-10 DIAGNOSIS — Z90710 Acquired absence of both cervix and uterus: Secondary | ICD-10-CM | POA: Diagnosis present

## 2015-08-10 DIAGNOSIS — Z6841 Body Mass Index (BMI) 40.0 and over, adult: Secondary | ICD-10-CM | POA: Insufficient documentation

## 2015-08-10 DIAGNOSIS — N921 Excessive and frequent menstruation with irregular cycle: Secondary | ICD-10-CM | POA: Insufficient documentation

## 2015-08-10 HISTORY — PX: LAPAROSCOPIC VAGINAL HYSTERECTOMY WITH SALPINGECTOMY: SHX6680

## 2015-08-10 LAB — PREGNANCY, URINE: Preg Test, Ur: NEGATIVE

## 2015-08-10 SURGERY — HYSTERECTOMY, VAGINAL, LAPAROSCOPY-ASSISTED, WITH SALPINGECTOMY
Anesthesia: General | Site: Abdomen | Laterality: Bilateral

## 2015-08-10 MED ORDER — ACETAMINOPHEN 10 MG/ML IV SOLN
INTRAVENOUS | Status: DC | PRN
  Administered 2015-08-10: 1000 mg via INTRAVENOUS

## 2015-08-10 MED ORDER — PHENYLEPHRINE 40 MCG/ML (10ML) SYRINGE FOR IV PUSH (FOR BLOOD PRESSURE SUPPORT)
PREFILLED_SYRINGE | INTRAVENOUS | Status: AC
  Filled 2015-08-10: qty 10

## 2015-08-10 MED ORDER — PROPOFOL 10 MG/ML IV BOLUS
INTRAVENOUS | Status: DC | PRN
  Administered 2015-08-10: 250 mg via INTRAVENOUS
  Administered 2015-08-10: 50 mg via INTRAVENOUS

## 2015-08-10 MED ORDER — VASOPRESSIN 20 UNIT/ML IV SOLN
INTRAVENOUS | Status: AC
  Filled 2015-08-10: qty 1

## 2015-08-10 MED ORDER — SUGAMMADEX SODIUM 200 MG/2ML IV SOLN
INTRAVENOUS | Status: AC
  Filled 2015-08-10: qty 2

## 2015-08-10 MED ORDER — ONDANSETRON HCL 4 MG/2ML IJ SOLN: 4.0000 mg | Freq: Four times a day (QID) | INTRAMUSCULAR | Status: DC | PRN

## 2015-08-10 MED ORDER — HYDROMORPHONE HCL 1 MG/ML IJ SOLN
INTRAMUSCULAR | Status: DC | PRN
  Administered 2015-08-10: 1 mg via INTRAVENOUS

## 2015-08-10 MED ORDER — PANTOPRAZOLE SODIUM 40 MG PO TBEC
40.0000 mg | DELAYED_RELEASE_TABLET | Freq: Every day | ORAL | Status: DC
  Administered 2015-08-11: 40 mg via ORAL
  Filled 2015-08-10: qty 1

## 2015-08-10 MED ORDER — PSEUDOEPHEDRINE-GUAIFENESIN ER 60-600 MG PO TB12
1.0000 | ORAL_TABLET | Freq: Two times a day (BID) | ORAL | Status: DC | PRN
  Filled 2015-08-10: qty 1

## 2015-08-10 MED ORDER — BUPIVACAINE HCL (PF) 0.25 % IJ SOLN
INTRAMUSCULAR | Status: AC
  Filled 2015-08-10: qty 30

## 2015-08-10 MED ORDER — ONDANSETRON HCL 4 MG/2ML IJ SOLN
INTRAMUSCULAR | Status: DC | PRN
  Administered 2015-08-10: 4 mg via INTRAVENOUS

## 2015-08-10 MED ORDER — NALOXONE HCL 0.4 MG/ML IJ SOLN: 0.4000 mg | INTRAMUSCULAR | Status: DC | PRN

## 2015-08-10 MED ORDER — LIDOCAINE HCL (CARDIAC) 20 MG/ML IV SOLN
INTRAVENOUS | Status: DC | PRN
  Administered 2015-08-10: 100 mg via INTRAVENOUS
  Administered 2015-08-10: 60 mg via INTRAVENOUS
  Administered 2015-08-10: 40 mg via INTRAVENOUS

## 2015-08-10 MED ORDER — ONDANSETRON HCL 4 MG/2ML IJ SOLN: 4.0000 mg | Freq: Once | INTRAMUSCULAR | Status: DC

## 2015-08-10 MED ORDER — FENTANYL CITRATE (PF) 100 MCG/2ML IJ SOLN
INTRAMUSCULAR | Status: DC | PRN
  Administered 2015-08-10 (×3): 50 ug via INTRAVENOUS
  Administered 2015-08-10: 100 ug via INTRAVENOUS
  Administered 2015-08-10 (×2): 50 ug via INTRAVENOUS

## 2015-08-10 MED ORDER — LACTATED RINGERS IV SOLN
INTRAVENOUS | Status: DC
  Administered 2015-08-10 (×4): via INTRAVENOUS

## 2015-08-10 MED ORDER — DEXAMETHASONE SODIUM PHOSPHATE 10 MG/ML IJ SOLN
INTRAMUSCULAR | Status: DC | PRN
  Administered 2015-08-10: 4 mg via INTRAVENOUS

## 2015-08-10 MED ORDER — SUGAMMADEX SODIUM 200 MG/2ML IV SOLN
INTRAVENOUS | Status: DC | PRN
  Administered 2015-08-10: 240 mg via INTRAVENOUS

## 2015-08-10 MED ORDER — ONDANSETRON HCL 4 MG PO TABS: 4.0000 mg | ORAL_TABLET | Freq: Four times a day (QID) | ORAL | Status: DC | PRN

## 2015-08-10 MED ORDER — SUGAMMADEX SODIUM 200 MG/2ML IV SOLN
INTRAVENOUS | Status: AC
  Filled 2015-08-10: qty 4

## 2015-08-10 MED ORDER — DEXAMETHASONE SODIUM PHOSPHATE 4 MG/ML IJ SOLN
INTRAMUSCULAR | Status: AC
  Filled 2015-08-10: qty 1

## 2015-08-10 MED ORDER — HYDROMORPHONE 1 MG/ML IV SOLN
INTRAVENOUS | Status: DC
  Administered 2015-08-10: 12:00:00 via INTRAVENOUS
  Administered 2015-08-10: 0.9 mg via INTRAVENOUS
  Administered 2015-08-10: 1.5 mg via INTRAVENOUS
  Administered 2015-08-10 – 2015-08-11 (×2): 0.3 mg via INTRAVENOUS
  Administered 2015-08-11: 1.2 mg via INTRAVENOUS
  Filled 2015-08-10: qty 25

## 2015-08-10 MED ORDER — BISACODYL 10 MG RE SUPP: 10.0000 mg | Freq: Every day | RECTAL | Status: DC | PRN

## 2015-08-10 MED ORDER — DOCUSATE SODIUM 100 MG PO CAPS
100.0000 mg | ORAL_CAPSULE | Freq: Two times a day (BID) | ORAL | Status: DC
  Administered 2015-08-10 – 2015-08-11 (×2): 100 mg via ORAL
  Filled 2015-08-10 (×2): qty 1

## 2015-08-10 MED ORDER — ACETAMINOPHEN 10 MG/ML IV SOLN
1000.0000 mg | Freq: Once | INTRAVENOUS | Status: DC
  Filled 2015-08-10: qty 100

## 2015-08-10 MED ORDER — SCOPOLAMINE 1 MG/3DAYS TD PT72
MEDICATED_PATCH | TRANSDERMAL | Status: DC
Start: 2015-08-10 — End: 2015-08-11
  Administered 2015-08-10: 1.5 mg via TRANSDERMAL
  Filled 2015-08-10: qty 1

## 2015-08-10 MED ORDER — HYDROMORPHONE HCL 1 MG/ML IJ SOLN
INTRAMUSCULAR | Status: AC
  Filled 2015-08-10: qty 1

## 2015-08-10 MED ORDER — FENTANYL CITRATE (PF) 100 MCG/2ML IJ SOLN
25.0000 ug | INTRAMUSCULAR | Status: DC | PRN
  Administered 2015-08-10 (×2): 50 ug via INTRAVENOUS

## 2015-08-10 MED ORDER — FENTANYL CITRATE (PF) 100 MCG/2ML IJ SOLN
INTRAMUSCULAR | Status: AC
  Filled 2015-08-10: qty 2

## 2015-08-10 MED ORDER — MIDAZOLAM HCL 2 MG/2ML IJ SOLN
INTRAMUSCULAR | Status: DC | PRN
  Administered 2015-08-10: 2 mg via INTRAVENOUS

## 2015-08-10 MED ORDER — ALBUTEROL SULFATE HFA 108 (90 BASE) MCG/ACT IN AERS
INHALATION_SPRAY | RESPIRATORY_TRACT | Status: AC
  Filled 2015-08-10: qty 6.7

## 2015-08-10 MED ORDER — DIPHENHYDRAMINE HCL 50 MG/ML IJ SOLN
12.5000 mg | Freq: Four times a day (QID) | INTRAMUSCULAR | Status: DC | PRN
Start: 2015-08-10 — End: 2015-08-11

## 2015-08-10 MED ORDER — KETOROLAC TROMETHAMINE 30 MG/ML IJ SOLN
INTRAMUSCULAR | Status: AC
  Administered 2015-08-10: 30 mg
  Filled 2015-08-10: qty 1

## 2015-08-10 MED ORDER — LIDOCAINE HCL (CARDIAC) 20 MG/ML IV SOLN
INTRAVENOUS | Status: AC
  Filled 2015-08-10: qty 10

## 2015-08-10 MED ORDER — ROCURONIUM BROMIDE 100 MG/10ML IV SOLN
INTRAVENOUS | Status: AC
  Filled 2015-08-10: qty 1

## 2015-08-10 MED ORDER — DIPHENHYDRAMINE HCL 12.5 MG/5ML PO ELIX
12.5000 mg | ORAL_SOLUTION | Freq: Four times a day (QID) | ORAL | Status: DC | PRN
  Filled 2015-08-10: qty 5

## 2015-08-10 MED ORDER — MIDAZOLAM HCL 2 MG/2ML IJ SOLN
INTRAMUSCULAR | Status: AC
  Filled 2015-08-10: qty 2

## 2015-08-10 MED ORDER — VASOPRESSIN 20 UNIT/ML IV SOLN
INTRAVENOUS | Status: DC | PRN
  Administered 2015-08-10: 17 mL via INTRAMUSCULAR

## 2015-08-10 MED ORDER — MENTHOL 3 MG MT LOZG: 1.0000 | LOZENGE | OROMUCOSAL | Status: DC | PRN

## 2015-08-10 MED ORDER — BUPIVACAINE HCL (PF) 0.25 % IJ SOLN
INTRAMUSCULAR | Status: DC | PRN
  Administered 2015-08-10: 20 mL

## 2015-08-10 MED ORDER — ROCURONIUM BROMIDE 100 MG/10ML IV SOLN
INTRAVENOUS | Status: DC | PRN
Start: 2015-08-10 — End: 2015-08-10
  Administered 2015-08-10: 50 mg via INTRAVENOUS
  Administered 2015-08-10: 20 mg via INTRAVENOUS
  Administered 2015-08-10: 5 mg via INTRAVENOUS
  Administered 2015-08-10: 10 mg via INTRAVENOUS

## 2015-08-10 MED ORDER — SCOPOLAMINE 1 MG/3DAYS TD PT72
1.0000 | MEDICATED_PATCH | Freq: Once | TRANSDERMAL | Status: DC
  Administered 2015-08-10: 1.5 mg via TRANSDERMAL

## 2015-08-10 MED ORDER — LACTATED RINGERS IV SOLN
INTRAVENOUS | Status: DC
  Administered 2015-08-10 – 2015-08-11 (×2): via INTRAVENOUS

## 2015-08-10 MED ORDER — SIMETHICONE 80 MG PO CHEW: 80.0000 mg | CHEWABLE_TABLET | Freq: Four times a day (QID) | ORAL | Status: DC | PRN

## 2015-08-10 MED ORDER — KETOROLAC TROMETHAMINE 30 MG/ML IJ SOLN: 30.0000 mg | Freq: Once | INTRAMUSCULAR | Status: DC

## 2015-08-10 MED ORDER — MEPERIDINE HCL 25 MG/ML IJ SOLN: 6.2500 mg | INTRAMUSCULAR | Status: DC | PRN

## 2015-08-10 MED ORDER — IBUPROFEN 600 MG PO TABS
600.0000 mg | ORAL_TABLET | Freq: Four times a day (QID) | ORAL | Status: DC | PRN
  Administered 2015-08-11: 600 mg via ORAL
  Filled 2015-08-10: qty 1

## 2015-08-10 MED ORDER — FENTANYL CITRATE (PF) 250 MCG/5ML IJ SOLN
INTRAMUSCULAR | Status: AC
  Filled 2015-08-10: qty 5

## 2015-08-10 MED ORDER — SODIUM CHLORIDE 0.9% FLUSH
9.0000 mL | INTRAVENOUS | Status: DC | PRN
Start: 2015-08-10 — End: 2015-08-11

## 2015-08-10 MED ORDER — SODIUM CHLORIDE 0.9 % IJ SOLN
INTRAMUSCULAR | Status: AC
  Filled 2015-08-10: qty 100

## 2015-08-10 MED ORDER — OXYCODONE-ACETAMINOPHEN 5-325 MG PO TABS
1.0000 | ORAL_TABLET | ORAL | Status: DC | PRN
  Administered 2015-08-11: 1 via ORAL
  Filled 2015-08-10: qty 2

## 2015-08-10 MED ORDER — PROPOFOL 10 MG/ML IV BOLUS
INTRAVENOUS | Status: AC
  Filled 2015-08-10: qty 40

## 2015-08-10 MED ORDER — PHENYLEPHRINE HCL 10 MG/ML IJ SOLN
INTRAMUSCULAR | Status: DC | PRN
  Administered 2015-08-10: 80 ug via INTRAVENOUS

## 2015-08-10 MED ORDER — ONDANSETRON HCL 4 MG/2ML IJ SOLN
INTRAMUSCULAR | Status: AC
  Filled 2015-08-10: qty 2

## 2015-08-10 MED ORDER — KETOROLAC TROMETHAMINE 30 MG/ML IJ SOLN: 30.0000 mg | Freq: Four times a day (QID) | INTRAMUSCULAR | Status: DC | PRN

## 2015-08-10 SURGICAL SUPPLY — 44 items
BARRIER ADHS 3X4 INTERCEED (GAUZE/BANDAGES/DRESSINGS) ×3 IMPLANT
BENZOIN TINCTURE PRP APPL 2/3 (GAUZE/BANDAGES/DRESSINGS) ×3 IMPLANT
BRR ADH 4X3 ABS CNTRL BYND (GAUZE/BANDAGES/DRESSINGS) ×1
CLOTH BEACON ORANGE TIMEOUT ST (SAFETY) ×3 IMPLANT
CONT PATH 16OZ SNAP LID 3702 (MISCELLANEOUS) ×3 IMPLANT
COVER BACK TABLE 60X90IN (DRAPES) ×3 IMPLANT
DECANTER SPIKE VIAL GLASS SM (MISCELLANEOUS) ×6 IMPLANT
DRSG COVADERM PLUS 2X2 (GAUZE/BANDAGES/DRESSINGS) ×6 IMPLANT
DRSG OPSITE POSTOP 3X4 (GAUZE/BANDAGES/DRESSINGS) ×3 IMPLANT
DURAPREP 26ML APPLICATOR (WOUND CARE) ×3 IMPLANT
ELECT LIGASURE LONG (ELECTRODE) ×3 IMPLANT
ELECT LIGASURE SHORT 9 REUSE (ELECTRODE) IMPLANT
ELECT REM PT RETURN 9FT ADLT (ELECTROSURGICAL) ×3
ELECTRODE REM PT RTRN 9FT ADLT (ELECTROSURGICAL) ×1 IMPLANT
GLOVE BIO SURGEON STRL SZ7 (GLOVE) ×6 IMPLANT
GLOVE BIOGEL PI IND STRL 7.0 (GLOVE) ×5 IMPLANT
GLOVE BIOGEL PI INDICATOR 7.0 (GLOVE) ×10
GLOVE ECLIPSE 6.0 STRL STRAW (GLOVE) ×3 IMPLANT
LEGGING LITHOTOMY PAIR STRL (DRAPES) ×3 IMPLANT
LIQUID BAND (GAUZE/BANDAGES/DRESSINGS) ×3 IMPLANT
NEEDLE SPNL 22GX3.5 QUINCKE BK (NEEDLE) ×3 IMPLANT
NS IRRIG 1000ML POUR BTL (IV SOLUTION) ×3 IMPLANT
PACK LAVH (CUSTOM PROCEDURE TRAY) ×3 IMPLANT
PACK ROBOTIC GOWN (GOWN DISPOSABLE) ×3 IMPLANT
PAD TRENDELENBURG POSITION (MISCELLANEOUS) ×3 IMPLANT
RETRACTOR TRAXI PANNICULUS (MISCELLANEOUS) ×1 IMPLANT
SET IRRIG TUBING LAPAROSCOPIC (IRRIGATION / IRRIGATOR) ×3 IMPLANT
SHEARS HARMONIC ACE PLUS 36CM (ENDOMECHANICALS) ×3 IMPLANT
SLEEVE XCEL OPT CAN 5 100 (ENDOMECHANICALS) ×6 IMPLANT
SUT CHROMIC 2 0 SH (SUTURE) IMPLANT
SUT CHROMIC 2 0 UR 5 27 (SUTURE) IMPLANT
SUT MON AB 4-0 PS1 27 (SUTURE) ×3 IMPLANT
SUT VIC AB 0 CT1 18XCR BRD8 (SUTURE) ×2 IMPLANT
SUT VIC AB 0 CT1 36 (SUTURE) ×3 IMPLANT
SUT VIC AB 0 CT1 8-18 (SUTURE) ×6
SUT VIC AB 2-0 CT1 (SUTURE) ×3 IMPLANT
SUT VICRYL 0 UR6 27IN ABS (SUTURE) ×3 IMPLANT
SUT VICRYL 1 TIES 12X18 (SUTURE) ×3 IMPLANT
TOWEL OR 17X24 6PK STRL BLUE (TOWEL DISPOSABLE) ×6 IMPLANT
TRAXI PANNICULUS RETRACTOR (MISCELLANEOUS) ×2
TRAY FOLEY CATH SILVER 14FR (SET/KITS/TRAYS/PACK) ×3 IMPLANT
TROCAR XCEL NON-BLD 5MMX100MML (ENDOMECHANICALS) ×3 IMPLANT
WARMER LAPAROSCOPE (MISCELLANEOUS) ×3 IMPLANT
WATER STERILE IRR 1000ML POUR (IV SOLUTION) ×3 IMPLANT

## 2015-08-10 NOTE — Transfer of Care (Signed)
Immediate Anesthesia Transfer of Care Note  Patient: Lauren Ward  Procedure(s) Performed: Procedure(s): LAPAROSCOPIC ASSISTED VAGINAL HYSTERECTOMY WITH SALPINGECTOMY (Bilateral)  Patient Location: PACU  Anesthesia Type:General  Level of Consciousness: awake, alert  and oriented  Airway & Oxygen Therapy: Patient Spontanous Breathing and Patient connected to face mask oxygen  Post-op Assessment: Report given to RN and Post -op Vital signs reviewed and stable  Post vital signs: Reviewed and stable  Last Vitals:  Filed Vitals:   08/10/15 0606  BP: 138/93  Pulse: 78  Temp: 37.2 C  Resp: 20    Last Pain: There were no vitals filed for this visit.    Patients Stated Pain Goal: 4 ([id number])  Complications: No apparent anesthesia complications

## 2015-08-10 NOTE — Anesthesia Procedure Notes (Signed)
Procedure Name: Intubation Date/Time: 08/10/2015 7:29 AM Performed by: Jonna Munro Pre-anesthesia Checklist: Patient identified, Emergency Drugs available, Suction available, Patient being monitored and Timeout performed Patient Re-evaluated:Patient Re-evaluated prior to inductionOxygen Delivery Method: Circle system utilized Preoxygenation: Pre-oxygenation with 100% oxygen Intubation Type: IV induction Ventilation: Mask ventilation without difficulty Laryngoscope Size: Mac and 3 Grade View: Grade I Tube type: Oral Tube size: 7.0 mm Number of attempts: 1 Airway Equipment and Method: Stylet Placement Confirmation: ETT inserted through vocal cords under direct vision,  positive ETCO2 and breath sounds checked- equal and bilateral Secured at: 21 cm Tube secured with: Tape Dental Injury: Teeth and Oropharynx as per pre-operative assessment

## 2015-08-10 NOTE — Op Note (Signed)
Lauren Ward, Lauren Ward             ACCOUNT NO.:  [id number]  MEDICAL RECORD NO.:  [id number]  LOCATION:  9317                          FACILITY:  [facility]  PHYSICIAN:  Jola Schmidt, MD   DATE OF BIRTH:  [birth year]  DATE OF PROCEDURE: DATE OF DISCHARGE:                              OPERATIVE REPORT   PREOPERATIVE DIAGNOSIS:  Adenomyosis and menometrorrhagia.  POSTOPERATIVE DIAGNOSIS:  Adenomyosis and menometrorrhagia.  PROCEDURES:  Laparoscopic-assisted vaginal hysterectomy with bilateral salpingectomy.  SURGEON:  Jola Schmidt, MD  ASSISTANT:  Annalee Genta, D.O. and technician.  EBL:  300.  URINE:  250 mL of clear urine out at the end of procedure.  DRAINS:  Foley catheter.  LOCAL:  Marcaine and vasopressin.  SPECIMEN:  Uterus with tubes to Pathology.  DISPOSITION:  To PACU, hemodynamically stable.  COMPLICATIONS:  None.  FINDINGS:  Small uterus less than 250 g.  Normal fallopian tubes consistent with previous history of tubal ligation.  Normal ovaries, not a lot of scar tissue on the lower uterine segment.  Redundant peritoneal fat.  DESCRIPTION OF PROCEDURE:  Lauren Ward was identified in the holding area.  She was then taken to the operating room with IV running.  She was placed on the operating room table in the supine position and was intubated.  She was then positioned in the dorsal lithotomy position. SCDs were on.  The patient was prepped and draped in a normal sterile fashion.  A Hulka uterine manipulator was placed in the cervix and uterus from below.  The patient was then re-prepped in the abdomen.  A time-out was performed prior to starting the procedure.  The patient received Ancef 3 g IV, SCDs were on and operating.  An infraumbilical incision was made in the anterior portion of the umbilicus.  The patient had a previous incision and that was in the lower portion, so we went above that.  Marcaine was injected prior to making that incision.   I then used a 5-mm trocar and went through the fascia under direct visualization and the peritoneum was then depth from skin, the fascia was surprisingly short.  Once the intraabdominal access was confirmed, CO2 gas was used to insufflate the abdomen.  Uterus was mobilized, Trendelenburg was obtained.  Of note, we were unable to get maximum Trendelenburg due to ventilation for the patient.  The 5 mm trocars were then placed in the right and left quadrants under direct visualization after injecting Marcaine.  The ovaries were then identified and the fallopian tubes were grasped and transected with the Harmonic scalpel.  The round ligament and uterine ovarian ligament were ligated with the Harmonic.  On the left side, there was some bleeding that was close to the uterus and the ovaries were somewhat very close to the ovaries until there was bleeding.  We would eventually use the Kleppingers for hemostasis on that side prior to going to below.  Same thing was done on the opposite side.  We were able to develop the bladder flap.  Again, she did not have a lot of scar tissue in the lower uterine segment.  We then moved below and the Hulka manipulator was removed.  The  short weighted speculum was initially used, but due to redundancy, we needed the longer weighted speculum and would eventually use a banana when the cuff was opened.  We used a curved Deaver to retract the bladder.  The vasopressin was used to inject the vaginal mucosa.  There was not a lot of blanching.  The Bovie was then used to transect the cervix.  The bladder seemed to be low on the anterior portion, so there was probably less than 2 cm of the cervix from the internal os, that was transected.  Then, the posterior cul-de-sac was entered with the Mayo scissors. Peritoneal access was confirmed when fluid was noted.  The long weighted banana was applied, then guided into the anterior cul-de-sac without issue.  The  peritoneum was high that was somewhat difficult to visualize, so we were able to safely enter the peritoneum.  Prior to that, we did grasp the uterine cycles and cut to improve visualization. We used 0 Vicryl to suture ligate.  Once both cul-de-sacs were entered, the cardinal ligaments on each side were transected with the LigaSure and cut with the Metzenbaum scissors.  That was completed up until the cornua of the uterus and then the uterus was removed easily with the fallopian tubes intact.  The angled sutures were used with a 0 pop at the corners of the cuff on each side.  The long Allis was then used to grasp the peritoneum and vaginal cuff.  Prior to closure of the cuff, the uterosacral ligaments were plicated together and the cuff was then closed with 0 Vicryl in a running locked fashion and the second layer of 2-0 Vicryl was used for imbrication.  The cuff was bleeding, but once it was reapproximated and the bleeding was managed, I did have to use moistened 4 x 18 to displace the bowel so we could safely not injure that.  Once the cuff was closed and hemostatic, a moistened Kerlix roll was placed.  Foley catheter remained in place as well.  Attention was then turned back to the abdomen.  The ovarian pedicles and the vaginal cuff appeared to be hemostatic.  We attempted to place Interceed, which was cut in half at the vaginal cuff, but due to the angulation, it was difficult to apply right where we wanted it, but it was placed.  The left ovary was we thought slightly oozy just tapped with a Kleppinger, but overall was very hemostatic.  The trocars were then removed under direct visualization.  The umbilical port was used to get all of the CO2 out as much as possible. Exploratory breasts were performed and then the trocar was removed.  The incisions were then reapproximated with 4-0 Monocryl in a subcuticular fashion and Dermabond was applied or LiquiBand.  All instruments,  sponge and needle counts were correct x3.  The patient tolerated the procedure well.  She woke up and went to the PACU in stable condition.     Jola Schmidt, MD     EBV/MEDQ  D:  08/10/2015  T:  08/10/2015  Job:  [id number]

## 2015-08-10 NOTE — Brief Op Note (Signed)
08/10/2015  10:20 AM  PATIENT:  Lauren Ward  40 y.o. female  PRE-OPERATIVE DIAGNOSIS:  N80.0 Adenomyosis N92.1 Menometrorrhagia N94.6 Dysmenorrhea  POST-OPERATIVE DIAGNOSIS:  Adenomyosis, Menometorrhagia  PROCEDURE:  Procedure(s): LAPAROSCOPIC ASSISTED VAGINAL HYSTERECTOMY WITH SALPINGECTOMY (Bilateral)  SURGEON:  Surgeon(s) and Role:    * Thurnell Lose, MD - Primary    * Janyth Pupa, DO - Assisting  PHYSICIAN ASSISTANT:   ASSISTANTS: Dr. Nelda Marseille   ANESTHESIA:   general  EBL:  Total I/O In: 2200 [I.V.:2200] Out: 550 [Urine:250; Blood:300]  BLOOD ADMINISTERED:none  DRAINS: Urinary Catheter (Foley)   LOCAL MEDICATIONS USED:  MARCAINE    and OTHER Vasopressin  SPECIMEN:  Source of Specimen:  Uterus with tubes  DISPOSITION OF SPECIMEN:  PATHOLOGY  COUNTS:  YES  TOURNIQUET:  * No tourniquets in log *  DICTATION: .Other Dictation: Dictation Number [id number]  PLAN OF CARE: Admit for overnight observation  PATIENT DISPOSITION:  PACU - hemodynamically stable.   Delay start of Pharmacological VTE agent (>24hrs) due to surgical blood loss or risk of bleeding: yes

## 2015-08-10 NOTE — Progress Notes (Signed)
Spoke to tiffany in [address] and asked if pt should receive 3gram ancef due to her bmi 119.863 and she said yes.Marland Kitchen

## 2015-08-10 NOTE — Anesthesia Postprocedure Evaluation (Signed)
Anesthesia Post Note  Patient: Lauren Ward  Procedure(s) Performed: Procedure(s) (LRB): LAPAROSCOPIC ASSISTED VAGINAL HYSTERECTOMY WITH SALPINGECTOMY (Bilateral)  Patient location during evaluation: PACU Anesthesia Type: General Level of consciousness: awake and alert Pain management: pain level controlled Vital Signs Assessment: post-procedure vital signs reviewed and stable Respiratory status: spontaneous breathing, nonlabored ventilation, respiratory function stable and patient connected to nasal cannula oxygen Cardiovascular status: blood pressure returned to baseline and stable Postop Assessment: no signs of nausea or vomiting Anesthetic complications: no     Last Vitals:  Filed Vitals:   08/10/15 0606 08/10/15 1005  BP: 138/93 125/81  Pulse: 78 114  Temp: 37.2 C 37.3 C  Resp: 20 21    Last Pain:  Filed Vitals:   08/10/15 1046  PainSc: 3    Pain Goal: Patients Stated Pain Goal: 4 (08/10/15 1005)               Alexis Frock

## 2015-08-10 NOTE — Interval H&P Note (Signed)
History and Physical Interval Note:  08/10/2015 7:05 AM  Lauren Ward  has presented today for surgery, with the diagnosis of N80.0 Adenomyosis N92.1 Menometrorrhagia N94.6 Dysmenorrhea  The various methods of treatment have been discussed with the patient and family. After consideration of risks, benefits and other options for treatment, the patient has consented to  Procedure(s): LAPAROSCOPIC ASSISTED VAGINAL HYSTERECTOMY WITH SALPINGECTOMY (Bilateral) as a surgical intervention .  The patient's history has been reviewed, patient examined, no change in status, stable for surgery.  I have reviewed the patient's chart and labs.  Questions were answered to the patient's satisfaction.     Simona Huh, Joffre Lucks

## 2015-08-10 NOTE — Discharge Instructions (Signed)

## 2015-08-11 ENCOUNTER — Encounter (HOSPITAL_COMMUNITY): Payer: Self-pay | Admitting: Obstetrics and Gynecology

## 2015-08-11 DIAGNOSIS — N921 Excessive and frequent menstruation with irregular cycle: Secondary | ICD-10-CM | POA: Diagnosis not present

## 2015-08-11 LAB — CBC
HEMATOCRIT: 32 % — AB (ref 36.0–46.0)
Hemoglobin: 10.5 g/dL — ABNORMAL LOW (ref 12.0–15.0)
MCH: 26.8 pg (ref 26.0–34.0)
MCHC: 32.8 g/dL (ref 30.0–36.0)
MCV: 81.6 fL (ref 78.0–100.0)
Platelets: 291 10*3/uL (ref 150–400)
RBC: 3.92 MIL/uL (ref 3.87–5.11)
RDW: 15 % (ref 11.5–15.5)
WBC: 11.6 10*3/uL — AB (ref 4.0–10.5)

## 2015-08-11 LAB — BASIC METABOLIC PANEL
ANION GAP: 6 (ref 5–15)
BUN: 7 mg/dL (ref 6–20)
CHLORIDE: 105 mmol/L (ref 101–111)
CO2: 27 mmol/L (ref 22–32)
Calcium: 8.2 mg/dL — ABNORMAL LOW (ref 8.9–10.3)
Creatinine, Ser: 0.87 mg/dL (ref 0.44–1.00)
GFR calc Af Amer: >60 mL/min (ref >60)
GFR calc non Af Amer: >60 mL/min (ref >60)
GLUCOSE: 97 mg/dL (ref 65–99)
POTASSIUM: 3.8 mmol/L (ref 3.5–5.1)
Sodium: 138 mmol/L (ref 135–145)

## 2015-08-11 MED ORDER — IBUPROFEN 600 MG PO TABS: 600.0000 mg | ORAL_TABLET | Freq: Four times a day (QID) | ORAL | Status: DC | PRN

## 2015-08-11 MED ORDER — OXYCODONE-ACETAMINOPHEN 5-325 MG PO TABS: 1.0000 | ORAL_TABLET | ORAL | Status: DC | PRN

## 2015-08-11 NOTE — Progress Notes (Signed)
Discharge teaching complete. Pt understood all instructions and did not have any questions. Pt pushed via wheelchair and discharged home to family. 

## 2015-08-11 NOTE — Progress Notes (Signed)
Pt vaginal packing and foley was removed . Pt tolerated well.

## 2015-09-22 ENCOUNTER — Other Ambulatory Visit: Payer: Self-pay | Admitting: Internal Medicine

## 2015-09-22 DIAGNOSIS — Z1231 Encounter for screening mammogram for malignant neoplasm of breast: Secondary | ICD-10-CM

## 2015-09-22 DIAGNOSIS — Z9889 Other specified postprocedural states: Secondary | ICD-10-CM

## 2015-09-22 NOTE — Discharge Summary (Signed)
Physician Discharge Summary  Patient ID: Lauren Ward MRN: [id number] DOB/AGE: 11-04-1975 40 y.o.  Admit date: 08/10/2015 Discharge date: 09/22/2015  Admission Diagnoses: Adenomyosis  Discharge Diagnoses:  Active Problems:   S/P laparoscopic assisted vaginal hysterectomy (LAVH)   Discharged Condition: good  Hospital Course: Pt had an uncomplicated LAVH/BS.  Post op course was uncomplicated.  Pain was controlled, pt ambulating well.  Consults: None  Significant Diagnostic Studies: labs: preop Hg 11.8 post op 10.5  Treatments: surgery: LAVH/BS  Discharge Exam: Blood pressure 117/72, pulse 66, temperature 98.6 F (37 C), temperature source Oral, resp. rate 16, height 5\' 2"  (1.575 m), weight 119.863 kg (264 lb 4 oz), SpO2 98 %. Gen:  NAD, Abdomen:  Soft, obese, incisions clean and dry. Ext:  No calf tenderness  Disposition: 01-Home or Self Care  Discharge Instructions    Diet - low sodium heart healthy    Complete by:  As directed      Discharge instructions    Complete by:  As directed   See discharge instructions.     Discharge wound care:    Complete by:  As directed   Keep incisions clean and dry.     Increase activity slowly    Complete by:  As directed      Lifting restrictions    Complete by:  As directed   No heavy lifting greater than 15 pounds.     Sexual Activity Restrictions    Complete by:  As directed   Pelvic rest x 6 weeks.            Medication List    TAKE these medications        diphenhydrAMINE 25 MG tablet  Commonly known as:  BENADRYL  Take 25 mg by mouth at bedtime as needed for allergies.     ibuprofen 600 MG tablet  Commonly known as:  ADVIL,MOTRIN  Take 1 tablet (600 mg total) by mouth every 6 (six) hours as needed (mild pain).     omeprazole 20 MG capsule  Commonly known as:  PRILOSEC  Take 1 capsule (20 mg total) by mouth daily.     oxyCODONE-acetaminophen 5-325 MG tablet  Commonly known as:  PERCOCET/ROXICET  Take 1-2  tablets by mouth every 4 (four) hours as needed (moderate to severe pain (when tolerating fluids)).     pseudoephedrine-guaifenesin 60-600 MG 12 hr tablet  Commonly known as:  MUCINEX D  Take 1 tablet by mouth every 12 (twelve) hours as needed for congestion.     Vitamin D 2000 units tablet  Take 4,000 Units by mouth daily.           Follow-up Information    Follow up with Thurnell Lose, MD. Go in 2 weeks.   Specialty:  Obstetrics and Gynecology   Why:  Post op follow up   Contact information:   [address] [place] [address] [phone]       Signed: Thurnell Lose 09/22/2015, 1:03 PM

## 2015-10-17 ENCOUNTER — Ambulatory Visit
Admission: RE | Admit: 2015-10-17 | Discharge: 2015-10-17 | Disposition: A | Discharge status: Home / Self Care | Payer: BLUE CROSS/BLUE SHIELD | Source: Ambulatory Visit | Attending: Internal Medicine | Admitting: Internal Medicine

## 2015-10-17 DIAGNOSIS — Z9889 Other specified postprocedural states: Secondary | ICD-10-CM

## 2015-10-17 DIAGNOSIS — Z1231 Encounter for screening mammogram for malignant neoplasm of breast: Secondary | ICD-10-CM

## 2015-12-13 ENCOUNTER — Ambulatory Visit (INDEPENDENT_AMBULATORY_CARE_PROVIDER_SITE_OTHER): Payer: BLUE CROSS/BLUE SHIELD | Admitting: Gastroenterology

## 2015-12-13 ENCOUNTER — Other Ambulatory Visit (INDEPENDENT_AMBULATORY_CARE_PROVIDER_SITE_OTHER): Payer: BLUE CROSS/BLUE SHIELD

## 2015-12-13 ENCOUNTER — Encounter: Payer: Self-pay | Admitting: Gastroenterology

## 2015-12-13 VITALS — BP 120/80 | HR 73 | Ht 62.0 in | Wt 269.0 lb

## 2015-12-13 DIAGNOSIS — R1031 Right lower quadrant pain: Secondary | ICD-10-CM | POA: Diagnosis not present

## 2015-12-13 LAB — C-REACTIVE PROTEIN: CRP: 3.4 mg/dL (ref 0.5–20.0)

## 2015-12-13 LAB — SEDIMENTATION RATE: Sed Rate: 29 mm/hr — ABNORMAL HIGH (ref 0–20)

## 2015-12-13 MED ORDER — DICYCLOMINE HCL 10 MG PO CAPS: 10.0000 mg | ORAL_CAPSULE | Freq: Three times a day (TID) | ORAL | 1 refills | Status: DC

## 2015-12-13 NOTE — Patient Instructions (Addendum)
Your physician has requested that you go to the basement for  lab work before leaving today.   We have sent the following medications to your pharmacy for you to pick up at your convenience: Bentyl 10 mg three times a day  Please keep your follow up with Dr. Henrene Pastor in October.

## 2015-12-13 NOTE — Progress Notes (Signed)
Agree with initial assessment and plans as outlined 

## 2015-12-13 NOTE — Progress Notes (Signed)
12/13/2015 ZYVA ZINS [id number] 27-Jul-1975   HISTORY OF PRESENT ILLNESS:  This is a 40 year old female who is known to Dr. Henrene Pastor for some upper GI issues for which she underwent endoscopy in the past.  She presents to our office today with complaints of right-sided abdominal pain. She describes this is an intermittent burning type pain on the right side of her abdomen. This has been present for the past several months and in fact she went to the emergency department in January and had a CT scan of the abdomen and pelvis with contrast that was unremarkable at that time. She says that the pain can reach a 5 out of 10 on the pain scale and she describes it as "pretty uncomfortable ". She says that it can last for several days but then sometimes goes away and she has weeks in between when she has no pain at all. She cannot identify any particular foods that seem to bring this on. When she went to the emergency department they gave her a GI cocktail which she says improved the pain and also thinks that Maalox helps some as well. She denies any heartburn/reflux/indigestion type symptoms. She does take omeprazole 20 mg daily and has been on that for quite some time. She takes ibuprofen on rare occasions but no regular NSAID use. She denies any nausea, vomiting, fever, chills, diarrhea, constipation, change in bowel habits, rectal bleeding, weight loss. Lab studies have been overall unremarkable/unrevealing as well. She just had some other labs performed by her PCP again last week including CBC and CMP which she showed me on her phone and they were also unremarkable. She is status post cholecystectomy and has also had some other abdominal surgeries including cesarean section, diagnostic laparoscopy, laparoscopic vaginal hysterectomy with salpingectomy, and tubal ligation.  Past Medical History:  Diagnosis Date  . Esophageal stricture   . GERD (gastroesophageal reflux disease)   . Hiatal hernia   .  Peptic stricture of esophagus    Past Surgical History:  Procedure Laterality Date  . BREAST REDUCTION SURGERY    . CESAREAN SECTION    . CHOLECYSTECTOMY    . DIAGNOSTIC LAPAROSCOPY    . LAPAROSCOPIC VAGINAL HYSTERECTOMY WITH SALPINGECTOMY Bilateral 08/10/2015   Procedure: LAPAROSCOPIC ASSISTED VAGINAL HYSTERECTOMY WITH SALPINGECTOMY;  Surgeon: Thurnell Lose, MD;  Location: [facility];  Service: Gynecology;  Laterality: Bilateral;  . TONSILLECTOMY    . TUBAL LIGATION    . WISDOM TOOTH EXTRACTION      reports that she has never smoked. She has never used smokeless tobacco. She reports that she does not drink alcohol or use drugs. family history includes Diabetes in her maternal grandmother and paternal grandmother; Hypertension in her father and mother. No Known Allergies    Outpatient Encounter Prescriptions as of 12/13/2015  Medication Sig  . Cholecalciferol (VITAMIN D) 2000 units tablet Take 4,000 Units by mouth daily.   . diphenhydrAMINE (BENADRYL) 25 MG tablet Take 25 mg by mouth at bedtime as needed for allergies.  Marland Kitchen ibuprofen (ADVIL,MOTRIN) 600 MG tablet Take 1 tablet (600 mg total) by mouth every 6 (six) hours as needed (mild pain).  Marland Kitchen omeprazole (PRILOSEC) 20 MG capsule Take 1 capsule (20 mg total) by mouth daily.  . pseudoephedrine-guaifenesin (MUCINEX D) 60-600 MG 12 hr tablet Take 1 tablet by mouth every 12 (twelve) hours as needed for congestion.  . [DISCONTINUED] oxyCODONE-acetaminophen (PERCOCET/ROXICET) 5-325 MG tablet Take 1-2 tablets by mouth every 4 (four) hours as needed (moderate  to severe pain (when tolerating fluids)).   No facility-administered encounter medications on file as of 12/13/2015.      REVIEW OF SYSTEMS  : All other systems reviewed and negative except where noted in the History of Present Illness.   PHYSICAL EXAM: BP 120/80   Pulse 73   Ht 5\' 2"  (1.575 m)   Wt 269 lb (122 kg)   LMP 07/12/2015 (Exact Date)   BMI 49.20 kg/m  General: Well  developed black female in no acute distress Head: Normocephalic and atraumatic Eyes:  Sclerae anicteric, conjunctiva pink. Ears: Normal auditory acuity. Lungs: Clear throughout to auscultation Heart: Regular rate and rhythm Abdomen: Soft, non-distended.  Normal bowel sounds.  Mild right mid-abdominal TTP. Musculoskeletal: Symmetrical with no gross deformities  Skin: No lesions on visible extremities Extremities: No edema  Neurological: Alert oriented x 4, grossly non-focal Psychological:  Alert and cooperative. Normal mood and affect  ASSESSMENT AND PLAN: -40 year old female with complaints of right mid-abdominal pain/discomfort.  No other associated symptoms.  Unsure of the cause of her pain.  ? IBS/intestinal spasm vs scar tissue/adhesive disease from other surgeries.  CT scan and general labs negative.  Will check CRP and sed rate.  Otherwise will try Bentyl 10 mg TID.  Follow-up in approximately 8 weeks.    CC:  Glendale Chard, MD

## 2015-12-14 ENCOUNTER — Other Ambulatory Visit: Payer: Self-pay | Admitting: Obstetrics and Gynecology

## 2016-02-08 ENCOUNTER — Ambulatory Visit (INDEPENDENT_AMBULATORY_CARE_PROVIDER_SITE_OTHER): Payer: BLUE CROSS/BLUE SHIELD | Admitting: Internal Medicine

## 2016-02-08 ENCOUNTER — Encounter: Payer: Self-pay | Admitting: Internal Medicine

## 2016-02-08 ENCOUNTER — Encounter (INDEPENDENT_AMBULATORY_CARE_PROVIDER_SITE_OTHER): Payer: Self-pay

## 2016-02-08 VITALS — BP 130/70 | HR 69 | Ht 62.0 in | Wt 274.2 lb

## 2016-02-08 DIAGNOSIS — K2100 Gastro-esophageal reflux disease with esophagitis, without bleeding: Secondary | ICD-10-CM

## 2016-02-08 DIAGNOSIS — R1011 Right upper quadrant pain: Secondary | ICD-10-CM

## 2016-02-08 NOTE — Progress Notes (Signed)
HISTORY OF PRESENT ILLNESS:  Lauren Ward is a 40 y.o. female with morbid obesity, GERD, and peptic stricture. I last saw the patient in the office 2013. She was seen most recently in the office by the GI physician assistant 12/13/2015 regarding right-sided abdominal pain. She was evaluated in the emergency room with negative CT scan and labs. She is status post multiple abdominal pelvic surgeries including cholecystectomy. At the time of her last evaluation was felt to have pain possibly secondary to adhesions or spasm. She had normal CRP and an insignificant mild elevation of sedimentation rate. Routine follow-up was scheduled for this time. Patient reports that she has only had 1 episode of right upper quadrant discomfort since her last visit. She treated this with Bentyl which resulted in abrupt improvement. She is pleased. No new complaints.  REVIEW OF SYSTEMS:  All non-GI ROS negative except for sinus and allergy trouble  Past Medical History:  Diagnosis Date  . Esophageal stricture   . GERD (gastroesophageal reflux disease)   . Hiatal hernia   . Peptic stricture of esophagus     Past Surgical History:  Procedure Laterality Date  . BREAST REDUCTION SURGERY    . CESAREAN SECTION    . CHOLECYSTECTOMY    . DIAGNOSTIC LAPAROSCOPY    . LAPAROSCOPIC VAGINAL HYSTERECTOMY WITH SALPINGECTOMY Bilateral 08/10/2015   Procedure: LAPAROSCOPIC ASSISTED VAGINAL HYSTERECTOMY WITH SALPINGECTOMY;  Surgeon: Thurnell Lose, MD;  Location: [facility];  Service: Gynecology;  Laterality: Bilateral;  . TONSILLECTOMY    . TUBAL LIGATION    . WISDOM TOOTH EXTRACTION      Social History Lauren Ward  reports that she has never smoked. She has never used smokeless tobacco. She reports that she does not drink alcohol or use drugs.  family history includes Diabetes in her maternal grandmother and paternal grandmother; Hypertension in her father and mother.  No Known Allergies     PHYSICAL  EXAMINATION: Vital signs: BP 130/70   Pulse 69   Ht 5\' 2"  (1.575 m)   Wt 274 lb 3.2 oz (124.4 kg)   LMP 07/12/2015 (Exact Date)   SpO2 98%   BMI 50.15 kg/m   Constitutional:Pleasant, obese generally well-appearing, no acute distress Psychiatric: alert and oriented x3, cooperative Eyes: extraocular movements intact, anicteric, conjunctiva pink Mouth: oral pharynx moist, no lesions Neck: supple no lymphadenopathy Cardiovascular: heart regular rate and rhythm, no murmur Lungs: clear to auscultation bilaterally Abdomen: soft, obese, nontender, nondistended, no obvious ascites, no peritoneal signs, normal bowel sounds, no organomegaly Rectal: Omitted Extremities: no clubbing cyanosis or lower extremity edema bilaterally Skin: no lesions on visible extremities Neuro: No focal deficits. Cranial nerves intact  ASSESSMENT:  #1. Upper quadrant pain. Suspect adhesions, muscular skeletal, or spasm. Responded to antispasmodics. Negative workup. #2. Status post cholecystectomy remotely #3. GERD, complicated by peptic stricture. Status post EGD 2003 #4. Obesity   PLAN:  #1. Reflux precautions with attention to weight loss #2. Continue PPI. Lowest dose to control symptoms #3. Discussion on right upper quadrant pain, suspected etiologies, and treatment #4. Continue with antispasmodic Bentyl as needed #5. Routine GI follow-up 2 years. Interval follow-up as needed   20 minutes spent face-to-face with the patient. Greater than 50% a time use for counseling regarding her right upper quadrant pain, obesity, GERD

## 2016-02-08 NOTE — Patient Instructions (Signed)
Please follow up as needed 

## 2016-07-23 ENCOUNTER — Emergency Department (HOSPITAL_COMMUNITY): Payer: BLUE CROSS/BLUE SHIELD

## 2016-07-23 ENCOUNTER — Encounter (HOSPITAL_COMMUNITY): Payer: Self-pay | Admitting: Emergency Medicine

## 2016-07-23 ENCOUNTER — Emergency Department (HOSPITAL_COMMUNITY)
Admission: EM | Admit: 2016-07-23 | Discharge: 2016-07-23 | Disposition: A | Discharge status: Home / Self Care | Payer: BLUE CROSS/BLUE SHIELD | Attending: Emergency Medicine | Admitting: Emergency Medicine

## 2016-07-23 DIAGNOSIS — Z79899 Other long term (current) drug therapy: Secondary | ICD-10-CM | POA: Diagnosis not present

## 2016-07-23 DIAGNOSIS — K598 Other specified functional intestinal disorders: Secondary | ICD-10-CM | POA: Diagnosis not present

## 2016-07-23 DIAGNOSIS — R1031 Right lower quadrant pain: Secondary | ICD-10-CM | POA: Insufficient documentation

## 2016-07-23 DIAGNOSIS — K589 Irritable bowel syndrome without diarrhea: Secondary | ICD-10-CM

## 2016-07-23 DIAGNOSIS — R109 Unspecified abdominal pain: Secondary | ICD-10-CM | POA: Diagnosis present

## 2016-07-23 LAB — COMPREHENSIVE METABOLIC PANEL
ALT: 16 U/L (ref 14–54)
AST: 13 U/L — AB (ref 15–41)
Albumin: 3.3 g/dL — ABNORMAL LOW (ref 3.5–5.0)
Alkaline Phosphatase: 121 U/L (ref 38–126)
Anion gap: 5 (ref 5–15)
BUN: 11 mg/dL (ref 6–20)
CHLORIDE: 106 mmol/L (ref 101–111)
CO2: 26 mmol/L (ref 22–32)
CREATININE: 0.77 mg/dL (ref 0.44–1.00)
Calcium: 9.1 mg/dL (ref 8.9–10.3)
GFR calc Af Amer: >60 mL/min (ref >60)
GFR calc non Af Amer: >60 mL/min (ref >60)
Glucose, Bld: 100 mg/dL — ABNORMAL HIGH (ref 65–99)
Potassium: 3.9 mmol/L (ref 3.5–5.1)
SODIUM: 137 mmol/L (ref 135–145)
Total Bilirubin: 0.7 mg/dL (ref 0.3–1.2)
Total Protein: 7.4 g/dL (ref 6.5–8.1)

## 2016-07-23 LAB — URINALYSIS, ROUTINE W REFLEX MICROSCOPIC
BACTERIA UA: NONE SEEN
Bilirubin Urine: NEGATIVE
GLUCOSE, UA: NEGATIVE mg/dL
Ketones, ur: NEGATIVE mg/dL
Leukocytes, UA: NEGATIVE
Nitrite: NEGATIVE
Protein, ur: NEGATIVE mg/dL
Specific Gravity, Urine: 1.008 (ref 1.005–1.030)
pH: 7 (ref 5.0–8.0)

## 2016-07-23 LAB — CBC
HCT: 34.4 % — ABNORMAL LOW (ref 36.0–46.0)
Hemoglobin: 11.5 g/dL — ABNORMAL LOW (ref 12.0–15.0)
MCH: 26.5 pg (ref 26.0–34.0)
MCHC: 33.4 g/dL (ref 30.0–36.0)
MCV: 79.3 fL (ref 78.0–100.0)
PLATELETS: 268 10*3/uL (ref 150–400)
RBC: 4.34 MIL/uL (ref 3.87–5.11)
RDW: 14.6 % (ref 11.5–15.5)
WBC: 12.6 10*3/uL — ABNORMAL HIGH (ref 4.0–10.5)

## 2016-07-23 LAB — LIPASE, BLOOD: LIPASE: 14 U/L (ref 11–51)

## 2016-07-23 MED ORDER — POLYETHYLENE GLYCOL 3350 17 G PO PACK: 17.0000 g | PACK | Freq: Every day | ORAL | 0 refills | Status: AC

## 2016-07-23 MED ORDER — SODIUM CHLORIDE 0.9 % IV BOLUS (SEPSIS)
1000.0000 mL | Freq: Once | INTRAVENOUS | Status: AC
  Administered 2016-07-23: 1000 mL via INTRAVENOUS

## 2016-07-23 MED ORDER — DOCUSATE SODIUM 100 MG PO CAPS: 100.0000 mg | ORAL_CAPSULE | Freq: Two times a day (BID) | ORAL | 0 refills | Status: AC

## 2016-07-23 MED ORDER — ONDANSETRON 4 MG PO TBDP
4.0000 mg | ORAL_TABLET | Freq: Once | ORAL | Status: AC | PRN
  Administered 2016-07-23: 4 mg via ORAL
  Filled 2016-07-23: qty 1

## 2016-07-23 MED ORDER — IOPAMIDOL (ISOVUE-300) INJECTION 61%
INTRAVENOUS | Status: AC
  Administered 2016-07-23: 100 mL via INTRAVENOUS
  Filled 2016-07-23: qty 100

## 2016-07-23 NOTE — ED Notes (Signed)
ED Provider at bedside. 

## 2016-07-23 NOTE — ED Triage Notes (Signed)
Pt presents with right sided abdominal pain that started last night.  It ceased and started back around 1400.  Endorses nausea without vomiting.  Denies diarrhea.  Has been able to tolerate fluids at this time.  Last BM this morning.

## 2016-07-23 NOTE — ED Provider Notes (Signed)
[facility] DEPT Provider Note   CSN: [id number] Arrival date & time: 07/23/16  1543     History   Chief Complaint Chief Complaint  Patient presents with  . Abdominal Pain    HPI Lauren Ward is a 41 y.o. female.  HPI Patient presents with episodic right-sided abdominal pain. States she's had pain for the last year. Had 2 episodes starting at 2 AM last night. Describes the pain as sharp and associated with nausea. No radiation. No fever or chills. Pain resolved spontaneously. Had recurrence of pain around 2 PM today. The pain is improved though still present. Denies any urinary symptoms including dysuria, frequency, urgency or hematuria. No pelvic symptoms including pain, bleeding or discharge. Past Medical History:  Diagnosis Date  . Esophageal stricture   . GERD (gastroesophageal reflux disease)   . Hiatal hernia   . Peptic stricture of esophagus     Patient Active Problem List   Diagnosis Date Noted  . RLQ abdominal pain 12/13/2015  . S/P laparoscopic assisted vaginal hysterectomy (LAVH) 08/10/2015  . Fibroid, uterine   . CHOLELITHIASIS 11/26/2008  . ABDOMINAL PAIN-EPIGASTRIC 11/23/2008  . ESOPHAGEAL STRICTURE 05/10/2007  . GERD 05/10/2007  . PEPTIC STRICTURE 05/10/2007  . HIATAL HERNIA 06/06/2001    Past Surgical History:  Procedure Laterality Date  . BREAST REDUCTION SURGERY    . CESAREAN SECTION    . CHOLECYSTECTOMY    . DIAGNOSTIC LAPAROSCOPY    . LAPAROSCOPIC VAGINAL HYSTERECTOMY WITH SALPINGECTOMY Bilateral 08/10/2015   Procedure: LAPAROSCOPIC ASSISTED VAGINAL HYSTERECTOMY WITH SALPINGECTOMY;  Surgeon: Thurnell Lose, MD;  Location: [facility];  Service: Gynecology;  Laterality: Bilateral;  . TONSILLECTOMY    . TUBAL LIGATION    . WISDOM TOOTH EXTRACTION      OB History    No data available       Home Medications    Prior to Admission medications   Medication Sig Start Date End Date Taking? Authorizing Provider  azelastine (OPTIVAR) 0.05 %  ophthalmic solution Place 1 drop into both eyes daily. 07/11/16  Yes Historical Provider, MD  Cholecalciferol (VITAMIN D) 2000 units tablet Take 4,000 Units by mouth daily.    Yes Historical Provider, MD  dicyclomine (BENTYL) 10 MG capsule Take 1 capsule (10 mg total) by mouth 3 (three) times daily before meals. 12/13/15  Yes Jessica D Zehr, PA-C  omeprazole (PRILOSEC) 20 MG capsule Take 1 capsule (20 mg total) by mouth daily. 08/13/11  Yes Irene Shipper, MD  phentermine 15 MG capsule Take 15 mg by mouth every morning. 07/17/16  Yes Historical Provider, MD  docusate sodium (COLACE) 100 MG capsule Take 1 capsule (100 mg total) by mouth every 12 (twelve) hours. 07/23/16   Julianne Rice, MD  ibuprofen (ADVIL,MOTRIN) 600 MG tablet Take 1 tablet (600 mg total) by mouth every 6 (six) hours as needed (mild pain). Patient not taking: Reported on 07/23/2016 08/11/15   Thurnell Lose, MD  polyethylene glycol [facility] / Floria Raveling) packet Take 17 g by mouth daily. 07/23/16   Julianne Rice, MD    Family History Family History  Problem Relation Age of Onset  . Diabetes Maternal Grandmother   . Diabetes Paternal Grandmother   . Hypertension Mother   . Hypertension Father     Social History Social History  Substance Use Topics  . Smoking status: Never Smoker  . Smokeless tobacco: Never Used  . Alcohol use 0.0 oz/week     Comment: social     Allergies   Patient has  no known allergies.   Review of Systems Review of Systems  Constitutional: Negative for chills and fever.  HENT: Negative for congestion and sinus pressure.   Respiratory: Negative for cough, shortness of breath and wheezing.   Cardiovascular: Negative for chest pain, palpitations and leg swelling.  Gastrointestinal: Positive for abdominal pain and nausea. Negative for constipation, diarrhea and vomiting.  Genitourinary: Negative for dysuria, flank pain, hematuria, pelvic pain, vaginal bleeding and vaginal discharge.  Musculoskeletal:  Negative for arthralgias, back pain, myalgias and neck pain.  Skin: Negative for rash and wound.  Neurological: Negative for dizziness, weakness, light-headedness, numbness and headaches.  All other systems reviewed and are negative.    Physical Exam Updated Vital Signs BP 136/85 (BP Location: Right Arm)   Pulse 74   Temp 98 F (36.7 C) (Oral)   Resp 16   Ht 5' 1.5" (1.562 m)   Wt 280 lb (127 kg)   LMP 07/12/2015 (Exact Date)   SpO2 99%   BMI 52.05 kg/m   Physical Exam  Constitutional: She is oriented to person, place, and time. She appears well-developed and well-nourished. No distress.  HENT:  Head: Normocephalic and atraumatic.  Mouth/Throat: Oropharynx is clear and moist.  Eyes: EOM are normal. Pupils are equal, round, and reactive to light.  Neck: Normal range of motion. Neck supple.  Cardiovascular: Normal rate and regular rhythm.   Pulmonary/Chest: Effort normal and breath sounds normal.  Abdominal: Soft. Bowel sounds are normal. There is tenderness (mild to moderate right lower quadrant tenderness to palpation. There is no rebound or guarding.). There is no rebound and no guarding.  Musculoskeletal: Normal range of motion. She exhibits no edema or tenderness.  No CVA tenderness bilaterally.  Neurological: She is alert and oriented to person, place, and time.  Moving all extremities without deficit. Sensation intact.  Skin: Skin is warm and dry. Capillary refill takes less than 2 seconds. No rash noted. No erythema.  Psychiatric: She has a normal mood and affect. Her behavior is normal.  Nursing note and vitals reviewed.    ED Treatments / Results  Labs (all labs ordered are listed, but only abnormal results are displayed) Labs Reviewed  COMPREHENSIVE METABOLIC PANEL - Abnormal; Notable for the following:       Result Value   Glucose, Bld 100 (*)    Albumin 3.3 (*)    AST 13 (*)    All other components within normal limits  CBC - Abnormal; Notable for the  following:    WBC 12.6 (*)    Hemoglobin 11.5 (*)    HCT 34.4 (*)    All other components within normal limits  URINALYSIS, ROUTINE W REFLEX MICROSCOPIC - Abnormal; Notable for the following:    Hgb urine dipstick MODERATE (*)    Squamous Epithelial / LPF 0-5 (*)    All other components within normal limits  LIPASE, BLOOD    EKG  EKG Interpretation None       Radiology Ct Abdomen Pelvis W Contrast  Result Date: 07/23/2016 CLINICAL DATA:  Abdominal pain. Right lower quadrant pain severe since last night for EXAM: CT ABDOMEN AND PELVIS WITH CONTRAST TECHNIQUE: Multidetector CT imaging of the abdomen and pelvis was performed using the standard protocol following bolus administration of intravenous contrast. CONTRAST:  187mL ISOVUE-300 IOPAMIDOL (ISOVUE-300) INJECTION 61% COMPARISON:  05/07/2015 FINDINGS: Lower chest: No acute abnormality. Hepatobiliary: No focal liver abnormality is seen. Status post cholecystectomy. No biliary dilatation. Pancreas: Unremarkable. No pancreatic ductal dilatation or surrounding  inflammatory changes. Spleen: Normal in size without focal abnormality. Adrenals/Urinary Tract: The adrenal glands appear normal. Normal appearance of the kidneys. The urinary bladder appears normal. Stomach/Bowel: Stomach is within normal limits. Appendix appears normal. No evidence of bowel wall thickening, distention, or inflammatory changes. Vascular/Lymphatic: No significant vascular findings are present. No enlarged abdominal or pelvic lymph nodes. Reproductive: Status post hysterectomy. No adnexal masses. Other: No abdominal wall hernia or abnormality. No abdominopelvic ascites. Musculoskeletal: No acute or significant osseous findings. IMPRESSION: 1. No acute findings within the abdomen or pelvis. 2. Previous cholecystectomy. 3. The appendix is visualized and appears normal. Electronically Signed   By: Kerby Moors M.D.   On: 07/23/2016 19:43    Procedures Procedures (including  critical care time)  Medications Ordered in ED Medications  ondansetron (ZOFRAN-ODT) disintegrating tablet 4 mg (4 mg Oral Given 07/23/16 1641)  sodium chloride 0.9 % bolus 1,000 mL (1,000 mLs Intravenous New Bag/Given 07/23/16 1857)  iopamidol (ISOVUE-300) 61 % injection (100 mLs Intravenous Contrast Given 07/23/16 1926)     Initial Impression / Assessment and Plan / ED Course  I have reviewed the triage vital signs and the nursing notes.  Pertinent labs & imaging results that were available during my care of the patient were reviewed by me and considered in my medical decision making (see chart for details).     Patient with recurrent right lower quadrant pain. Chest mild elevation in white blood cell count. Get CT abdomen and pelvis rule out appendicitis though this is unlikely. Benign abdominal exam. Appendix clearly visualized and is normal. Patient does have a moderate amount of stool in the right colon. Appears to had similar findings on last years CT. Question whether the patient is having some bowel spasms due to constipation. Will start on bowel regimen and have follow-up with her gastroenterologist. Patient may also have irritable bowel syndrome. Given dietary recommendations. Return precautions given. Final Clinical Impressions(s) / ED Diagnoses   Final diagnoses:  Spasm of bowel  Right lower quadrant abdominal pain    New Prescriptions New Prescriptions   DOCUSATE SODIUM (COLACE) 100 MG CAPSULE    Take 1 capsule (100 mg total) by mouth every 12 (twelve) hours.   POLYETHYLENE GLYCOL (MIRALAX / GLYCOLAX) PACKET    Take 17 g by mouth daily.     Julianne Rice, MD 07/23/16 [phone]

## 2016-07-23 NOTE — ED Notes (Signed)
Patient went to CT. Will updated patients vital signs when they return.

## 2016-07-25 ENCOUNTER — Encounter: Payer: Self-pay | Admitting: Internal Medicine

## 2016-07-25 ENCOUNTER — Ambulatory Visit (INDEPENDENT_AMBULATORY_CARE_PROVIDER_SITE_OTHER): Payer: BLUE CROSS/BLUE SHIELD | Admitting: Internal Medicine

## 2016-07-25 VITALS — BP 122/82 | HR 76 | Ht 61.75 in | Wt 279.4 lb

## 2016-07-25 DIAGNOSIS — R112 Nausea with vomiting, unspecified: Secondary | ICD-10-CM | POA: Diagnosis not present

## 2016-07-25 DIAGNOSIS — R1011 Right upper quadrant pain: Secondary | ICD-10-CM | POA: Diagnosis not present

## 2016-07-25 DIAGNOSIS — K219 Gastro-esophageal reflux disease without esophagitis: Secondary | ICD-10-CM | POA: Diagnosis not present

## 2016-07-25 MED ORDER — ONDANSETRON 4 MG PO TBDP: 4.0000 mg | ORAL_TABLET | ORAL | 1 refills | Status: AC | PRN

## 2016-07-25 NOTE — Progress Notes (Signed)
HISTORY OF PRESENT ILLNESS:  Lauren Ward is a 41 y.o. female with morbid obesity, GERD, peptic stricture, and recurrent right upper quadrant pain status post cholecystectomy who presents herself today regarding right upper quadrant pain. The patient was last evaluated in this office 02/08/2016 for follow-up regarding right-sided abdominal pain. Prior to that she had a negative CT scan and unremarkable laboratories. She is status post multiple abdominal and pelvic surgeries including cholecystectomy. Her pain was felt to be secondary to adhesions or possibly musculoskeletal or intestinal spasm. She was seen in follow-up in reported one episode of pain which responded to Bentyl. She was told to continue PPI, lose weight, continue Bentyl as needed, and follow-up in 2 years unless otherwise indicated. 2 days ago she had problems with recurrent right upper quadrant pain which she described as cramping. The initial episode did not respond to dicyclomine 10 mg and lasted approximately 3 hours and was associated with nausea. She had a second episode shortly thereafter for which she went to the emergency room for evaluation. That encounter and data has been reviewed. Her blood work was unremarkable including normal liver tests and unremarkable CBC unchanged from previous values. She did have a contrast-enhanced CT scan of the abdomen and pelvis which was unremarkable postcholecystectomy with normal appendix visualized. She was given Zofran which helped her nausea. Her pain has resolved without recurrence. The patient states that her discomfort is exactly as that for which she has been evaluated previously. GI review of systems is currently negative. No reflux on PPI. She has not lost weight but tells me that she has enrolled in a program at [facility].  REVIEW OF SYSTEMS:  All non-GI ROS negative except for sinus and allergy trouble  Past Medical History:  Diagnosis Date  . Esophageal stricture   . GERD  (gastroesophageal reflux disease)   . Hiatal hernia   . Peptic stricture of esophagus     Past Surgical History:  Procedure Laterality Date  . BREAST REDUCTION SURGERY    . CESAREAN SECTION    . CHOLECYSTECTOMY    . DIAGNOSTIC LAPAROSCOPY    . LAPAROSCOPIC VAGINAL HYSTERECTOMY WITH SALPINGECTOMY Bilateral 08/10/2015   Procedure: LAPAROSCOPIC ASSISTED VAGINAL HYSTERECTOMY WITH SALPINGECTOMY;  Surgeon: Thurnell Lose, MD;  Location: [facility];  Service: Gynecology;  Laterality: Bilateral;  . TONSILLECTOMY    . TUBAL LIGATION    . WISDOM TOOTH EXTRACTION      Social History Lauren Ward  reports that she has never smoked. She has never used smokeless tobacco. She reports that she drinks alcohol. She reports that she does not use drugs.  family history includes Diabetes in her maternal grandmother and paternal grandmother; Hypertension in her father and mother.  No Known Allergies     PHYSICAL EXAMINATION: Vital signs: BP 122/82 (BP Location: Left Arm, Patient Position: Sitting, Cuff Size: Normal)   Pulse 76   Ht 5' 1.75" (1.568 m) Comment: height measured without shoes  Wt 279 lb 6 oz (126.7 kg)   LMP 07/12/2015 (Exact Date)   BMI 51.51 kg/m   Constitutional: Pleasant, obese, generally well-appearing, no acute distress Psychiatric: alert and oriented x3, cooperative Eyes: extraocular movements intact, anicteric, conjunctiva pink Mouth: oral pharynx moist, no lesions Neck: supple no lymphadenopathy Cardiovascular: heart regular rate and rhythm, no murmur Lungs: clear to auscultation bilaterally Abdomen: soft, obese, nontender, nondistended, no obvious ascites, no peritoneal signs, normal bowel sounds, no organomegaly. Previous surgical incisions well-healed Rectal: Omitted Extremities: no clubbing cyanosis or lower  extremity edema bilaterally Skin: no lesions on visible extremities Neuro: No focal deficits. Cranial nerves intact  ASSESSMENT:  #1. Recurrent right upper  quadrant pain as described. Again felt to be musculoskeletal versus spasm. Discomfort may be exacerbated by her obesity. She is status post cholecystectomy with no evidence of active pancreatic of biliary problems. #2. GERD. Symptoms controlled with PPI #3. Morbid obesity   PLAN:  #1. Okay to use Bentyl more frequently for pain. Could use up to 20 mg every 4 hours if needed for an acute attack #2. Prescribed Zofran for nausea if needed as this may help to keep her out of the emergency room #3. Reflux precautions #4. Continue PPI. Lowest dose to control reflux symptoms #5. Weight loss. I support her room in in the weight reduction program #6. Routine GI follow-up 2 years #7. Resume general medical care with PCP

## 2016-07-25 NOTE — Patient Instructions (Signed)
We have sent the following medications to your pharmacy for you to pick up at your convenience:  Zofran  Please follow up in 2 years

## 2016-10-02 ENCOUNTER — Other Ambulatory Visit: Payer: Self-pay | Admitting: Internal Medicine

## 2016-10-02 DIAGNOSIS — Z1231 Encounter for screening mammogram for malignant neoplasm of breast: Secondary | ICD-10-CM

## 2016-10-18 ENCOUNTER — Ambulatory Visit
Admission: RE | Admit: 2016-10-18 | Discharge: 2016-10-18 | Disposition: A | Discharge status: Home / Self Care | Payer: BLUE CROSS/BLUE SHIELD | Source: Ambulatory Visit | Attending: Internal Medicine | Admitting: Internal Medicine

## 2016-10-18 DIAGNOSIS — Z1231 Encounter for screening mammogram for malignant neoplasm of breast: Secondary | ICD-10-CM

## 2016-11-01 ENCOUNTER — Other Ambulatory Visit: Payer: Self-pay | Admitting: Gastroenterology

## 2016-11-01 NOTE — Telephone Encounter (Signed)
Lauren Ward this pt last saw Dr Henrene Pastor.

## 2016-12-27 IMAGING — CT CT ABD-PELV W/ CM
2 of 4 series · 17 of 46 positions shown, 19 images · IV contrast (OMNIPAQUE 300)
Comparison: 11/26/2008 ultrasound

CLINICAL DATA: Right-sided abdominal pain in the periumbilical
region for 2 days

EXAM:
CT ABDOMEN AND PELVIS WITH CONTRAST
TECHNIQUE: Multidetector CT imaging of the abdomen and pelvis was performed
using the standard protocol following bolus administration of
intravenous contrast.
CONTRAST:  100mL OMNIPAQUE IOHEXOL 300 MG/ML SOLN, 25mL OMNIPAQUE
IOHEXOL 300 MG/ML SOLN

[Series 2: abd/pel with · axial · 0.80mm/px · z∈[+1128,+1524]mm · 14 of 87 slices shown, 16 images]
[im 4/87  soft-tissue]
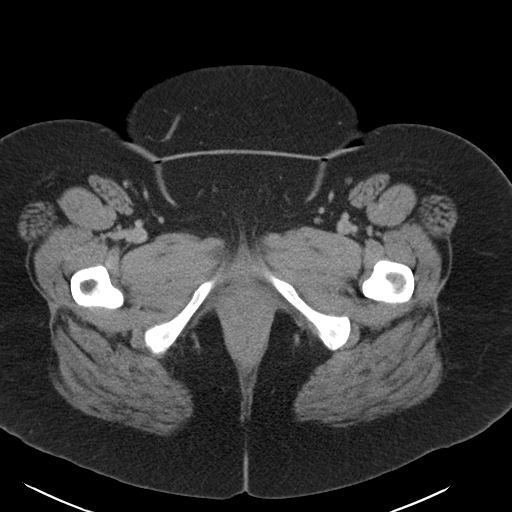
[im 4/87  bone]
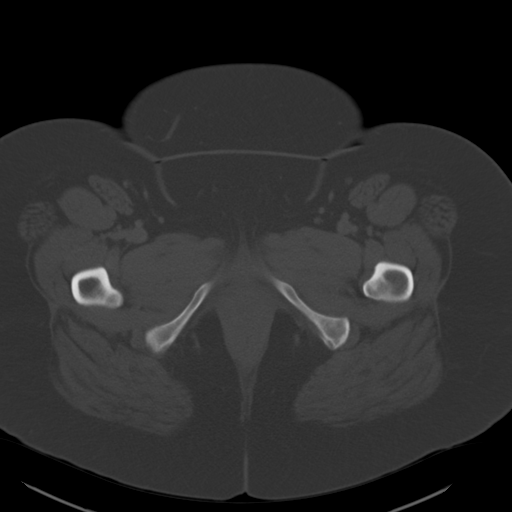
[im 12/87  soft-tissue]
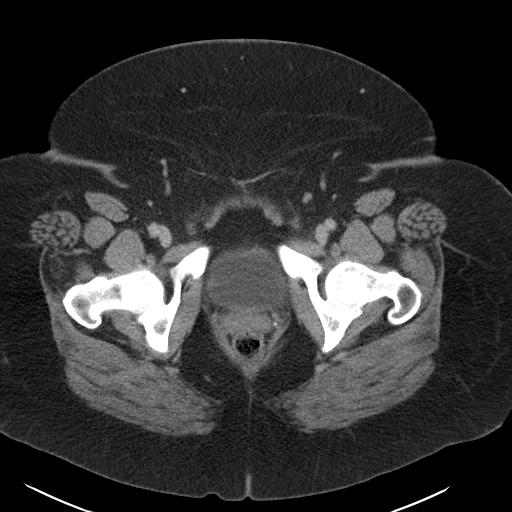
[im 15/87  soft-tissue]
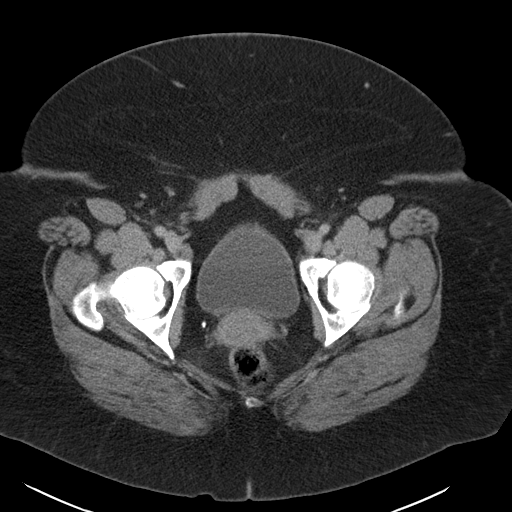
[im 23/87  soft-tissue]
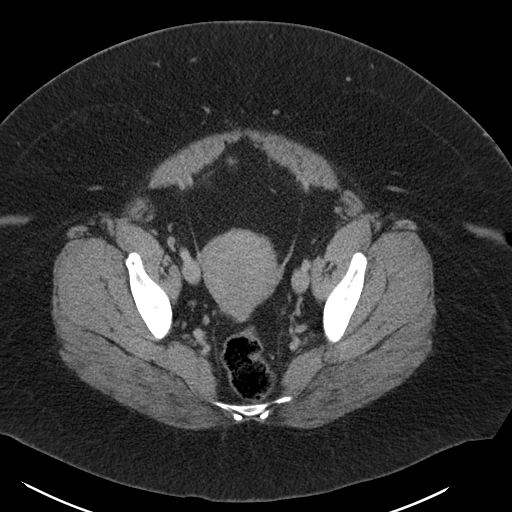
[im 30/87  soft-tissue]
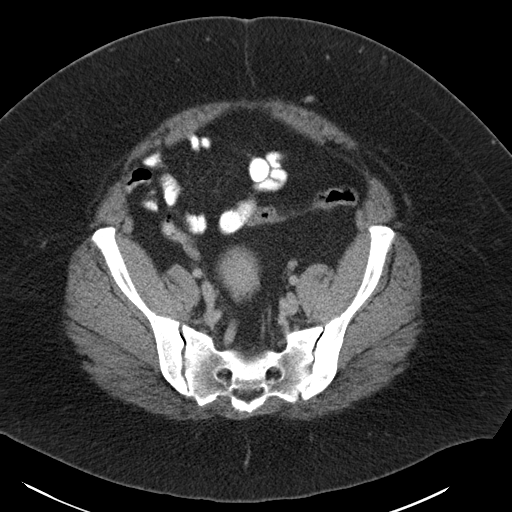
[im 34/87  soft-tissue]
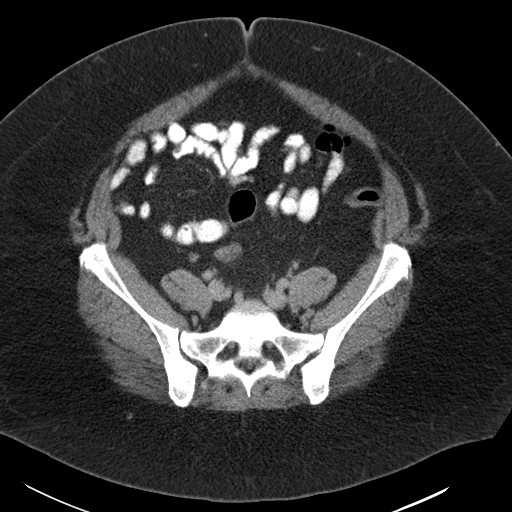
[im 42/87  soft-tissue]
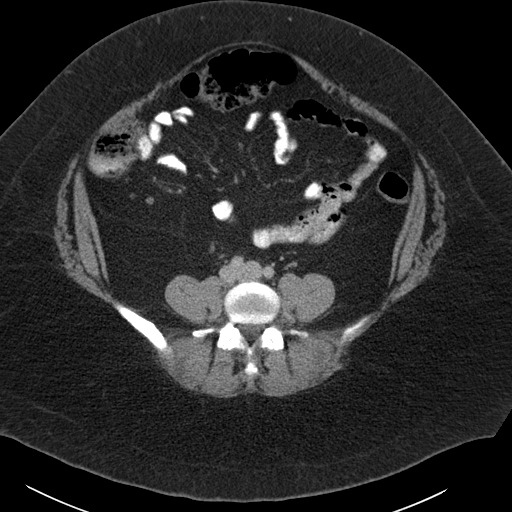
[im 45/87  soft-tissue]
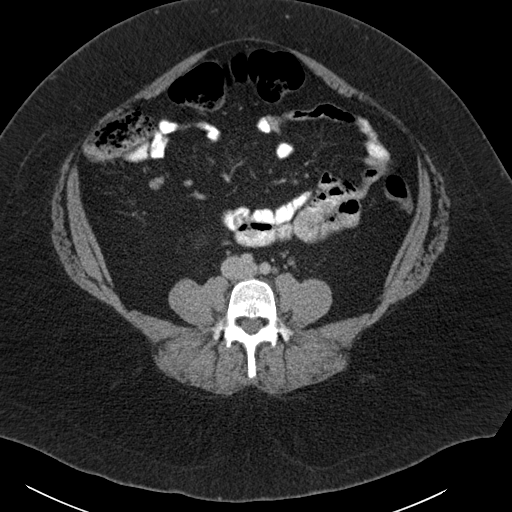
[im 53/87  soft-tissue]
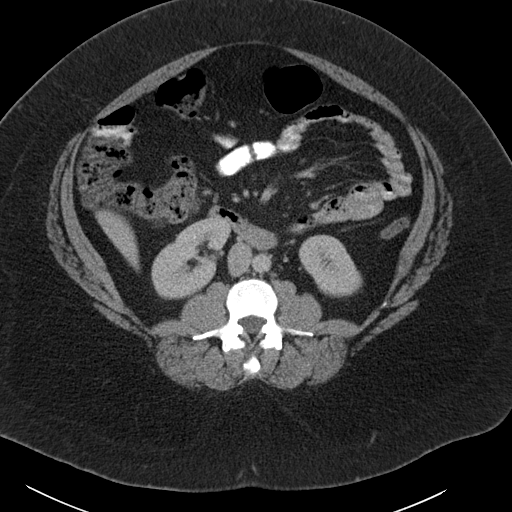
[im 53/87  bone]
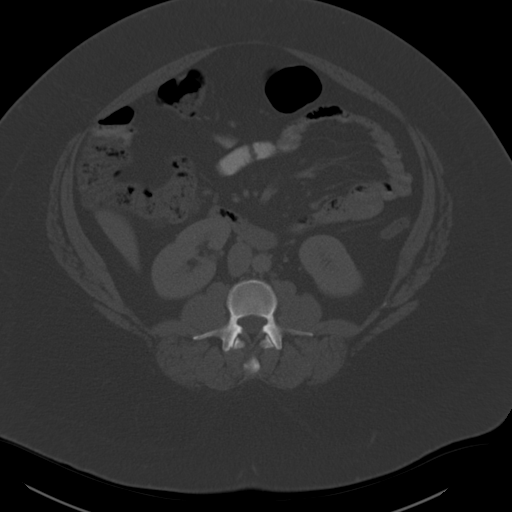
[im 57/87  soft-tissue]
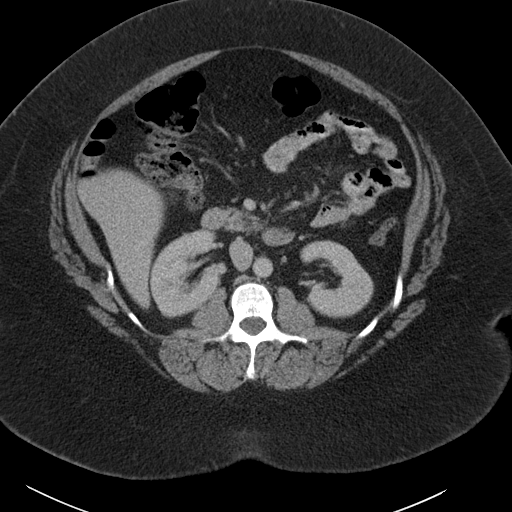
[im 64/87  soft-tissue]
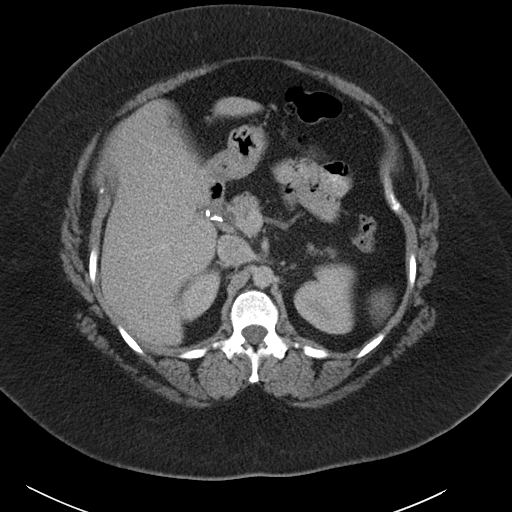
[im 72/87  soft-tissue]
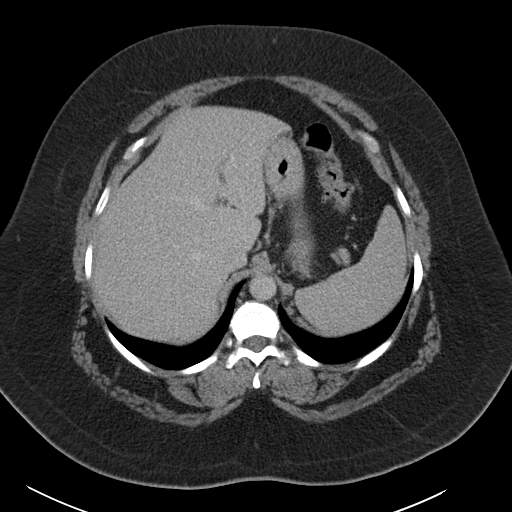
[im 75/87  soft-tissue]
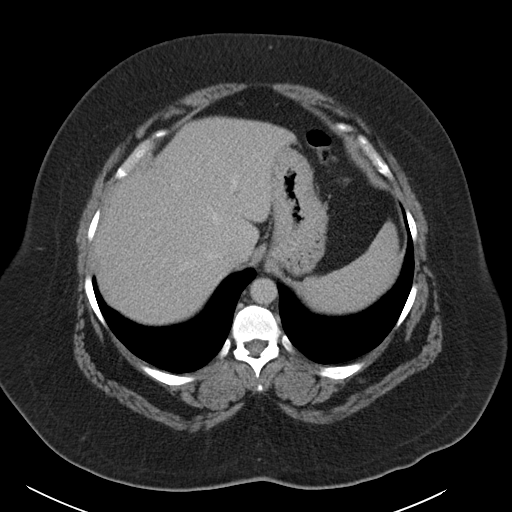
[im 83/87  soft-tissue]
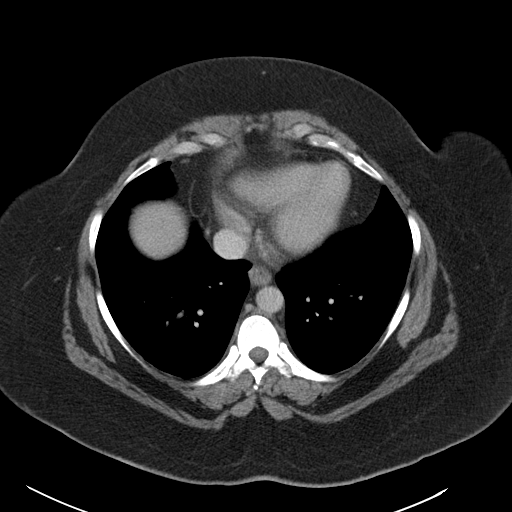

[Series 3: coronal a/|p · coronal · 0.94mm/px · 3 of 135 slices shown]
[im 45/135  soft-tissue]
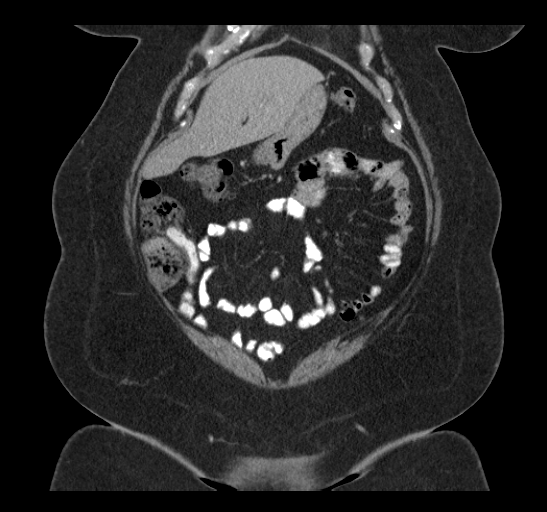
[im 60/135  soft-tissue]
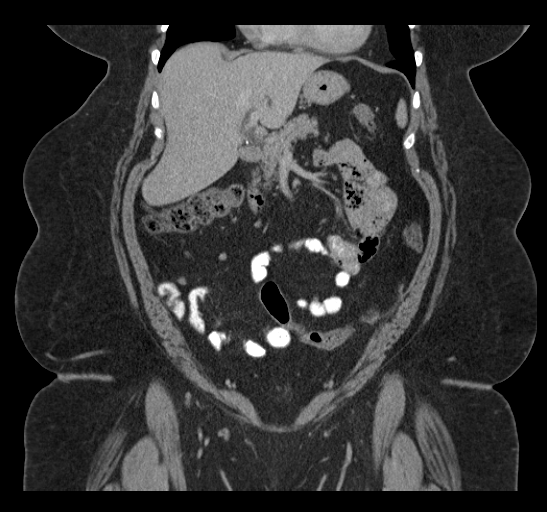
[im 75/135  soft-tissue]
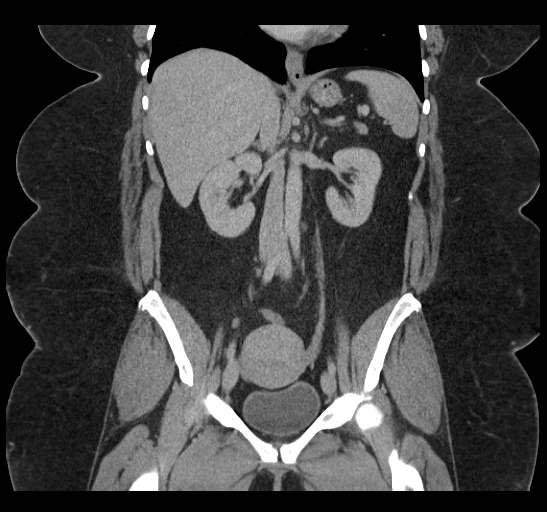

[17 of 46 positions shown; findings below may reference images not displayed]

FINDINGS: Lower chest:  Normal

Hepatobiliary: Status post cholecystectomy. Liver is normal. Mild
common bile duct dilatation as a result likely of cholecystectomy.

Pancreas: Normal

Spleen: Normal

Adrenals/Urinary Tract: Normal

Stomach/Bowel: Mild diverticulosis of the descending colon. No
evidence of diverticulitis. Appendix is normal. Small bowel is
normal. Stomach is normal.

Vascular/Lymphatic: No significant abdominal or pelvic adenopathy.
No vascular abnormalities.

Reproductive: Known MRI demonstrated adenomyosis of the uterus not
appreciated by CT scan reproductive organs appear negative.

Other: No free fluid

Musculoskeletal: No acute findings
IMPRESSION: No acute findings or visualized abnormalities to account for the
patient's symptoms.

## 2017-10-15 ENCOUNTER — Other Ambulatory Visit: Payer: Self-pay | Admitting: Internal Medicine

## 2017-10-15 DIAGNOSIS — Z1231 Encounter for screening mammogram for malignant neoplasm of breast: Secondary | ICD-10-CM

## 2017-11-05 ENCOUNTER — Ambulatory Visit
Admission: RE | Admit: 2017-11-05 | Discharge: 2017-11-05 | Disposition: A | Discharge status: Home / Self Care | Payer: BLUE CROSS/BLUE SHIELD | Source: Ambulatory Visit | Attending: Internal Medicine | Admitting: Internal Medicine

## 2017-11-05 DIAGNOSIS — Z1231 Encounter for screening mammogram for malignant neoplasm of breast: Secondary | ICD-10-CM

## 2018-02-19 ENCOUNTER — Encounter: Payer: Self-pay | Admitting: Internal Medicine

## 2020-06-29 NOTE — Nursing Note (Signed)
Adult Admission Assessment - Text       Perioperative Admission Assessment Entered On:  06/29/2020 12:15 EDT    Performed On:  06/29/2020 12:09 EDT by Maudie Flakes, RN, Neldon Newport               General   Call Start :   07/01/2020 9:33 EDT   Call Complete :   07/01/2020 9:46 EDT   Height/Length Estimated :   157.5 cm(Converted to: 62.01 in)    Weight   Estimated :   89.55 kg(Converted to: 197.424 lb)    Body Mass Index Estimated :   36.1 kg/m2   PAT Patient Procedure Verification :   Patient name and DOB confirmed with patient, Correct procedure scheduled confirmed with patient, Correct side/site confirmed with patient   Primary Care Physician/Specialists :   DR. ORR, OB-GYN  DR. Grayland Ormond, GI   Day of Proc Supp Prsn is the Emerg Cont :   Yes   Day of Procedure Support Person Name :   BRYANT   Day of Procedure Support Person Phone :   [phone]   Day of Procedure Support Person Relationship :   SPOUSE   Languages :   English   Information Given By :   Lurlean Nanny, RN, Neldon Newport - 07/01/2020 9:35 EDT     Allergies   (As Of: 07/05/2020 07:43:38 EDT)   Allergies (Active)   No Known Medication Allergies  Estimated Onset Date:   Unspecified ; Created By:   Joette Catching; Reaction Status:   Active ; Category:   Drug ; Substance:   No Known Medication Allergies ; Type:   Allergy ; Updated By:   Joette Catching; Reviewed Date:   07/05/2020 7:41 EDT        Medication History   Medication List   (As Of: 07/05/2020 07:43:38 EDT)   Normal Order    Lactated Ringers Injection solution 1,000 mL  :   Lactated Ringers Injection solution 1,000 mL ; Status:   Ordered ; Ordered As Mnemonic:   Lactated Ringers Injection 1,000 mL ; Simple Display Line:   30 mL/hr, IV ; Ordering Provider:   Markus Daft; Catalog Code:   Lactated Ringers Injection ; Order Dt/Tm:   07/04/2020 13:06:07 EDT ; Comment:   Perioperative use ONLY  For Non Dialysis Patient          lidocaine 1% PF Inj Soln 2 mL  :   lidocaine 1% PF Inj Soln 2 mL ; Status:    Ordered ; Ordered As Mnemonic:   lidocaine 1% preservative-free injectable solution ; Simple Display Line:   0.25 mL, ID, q60min, PRN: other (see comment) ; Ordering Provider:   Markus Daft; Catalog Code:   lidocaine ; Order Dt/Tm:   07/05/2020 07:34:06 EDT ; Comment:   to access lidocaine 1%  2 mL vial for IV start and Life Port access          sodium chloride 0.9% Inj Soln 10 mL syringe  :   sodium chloride 0.9% Inj Soln 10 mL syringe ; Status:   Ordered ; Ordered As Mnemonic:   sodium chloride 0.9% flush syringe range dose ; Simple Display Line:   30 mL, IV Push, q63min, PRN: other (see comment) ; Ordering Provider:   Markus Daft; Catalog Code:   sodium chloride flush ; Order Dt/Tm:   07/05/2020 07:34:06 EDT  Home Meds    azelastine-fluticasone nasal  :   azelastine-fluticasone nasal ; Status:   Documented ; Ordered As Mnemonic:   azelastine-fluticasone (Dymista) 137 mcg-50 mcg/inh nasal spray ; Simple Display Line:   1 sprays, Nasal, BID, PRN: allergic rhinitis/nasal congestion ; Catalog Code:   azelastine-fluticasone nasal ; Order Dt/Tm:   07/01/2020 09:45:43 EDT          dexlansoprazole  :   dexlansoprazole ; Status:   Documented ; Ordered As Mnemonic:   Dexilant 60 mg oral delayed release capsule ; Simple Display Line:   60 mg, 1 caps, Oral, qPM, 0 Refill(s) ; Catalog Code:   dexlansoprazole ; Order Dt/Tm:   07/01/2020 09:45:13 EDT          levocetirizine  :   levocetirizine ; Status:   Documented ; Ordered As Mnemonic:   Xyzal 5 mg oral tablet ; Simple Display Line:   5 mg, 1 tabs, Oral, qPM, 0 Refill(s) ; Catalog Code:   levocetirizine ; Order Dt/Tm:   07/01/2020 09:45:28 EDT          cholecalciferol  :   cholecalciferol ; Status:   Documented ; Ordered As Mnemonic:   Vitamin D3 50,000 intl units oral capsule ; Simple Display Line:   50,000 units, Oral, qWeek, 0 Refill(s) ; Catalog Code:   cholecalciferol ; Order Dt/Tm:   07/01/2020 09:45:43 EDT          ocular lubricant  :   ocular  lubricant ; Status:   Documented ; Ordered As Mnemonic:   Refresh Dry Eye Therapy ; Simple Display Line:   1 drops, Eye-Both, QID, PRN: dry eyes, 0 Refill(s) ; Catalog Code:   ocular lubricant ; Order Dt/Tm:   07/01/2020 09:44:35 EDT            Problem History   (As Of: 07/05/2020 07:43:38 EDT)   Problems(Active)    Acid reflux (SNOMED CT  :045409811353135014 )  Name of Problem:   Acid reflux ; Recorder:   Maudie FlakesBaden, RN, Neldon Newportana G; Confirmation:   Confirmed ; Classification:   Patient Stated ; Code:   [id number] ; Contributor System:   PowerChart ; Last Updated:   07/01/2020 9:42 EDT ; Life Cycle Date:   07/01/2020 ; Life Cycle Status:   Active ; Vocabulary:   SNOMED CT        Eczema (SNOMED CT  :2130865771923017 )  Name of Problem:   Eczema ; Recorder:   Maudie FlakesBaden, RN, Neldon Newportana G; Confirmation:   Confirmed ; Classification:   Patient Stated ; Code:   [id number] ; Contributor System:   PowerChart ; Last Updated:   07/01/2020 9:43 EDT ; Life Cycle Date:   07/01/2020 ; Life Cycle Status:   Active ; Vocabulary:   SNOMED CT        Environmental and seasonal allergies (SNOMED CT  :2841324401534-270-4316 )  Name of Problem:   Environmental and seasonal allergies ; Recorder:   Maudie FlakesBaden, RN, Neldon Newportana G; Confirmation:   Confirmed ; Classification:   Patient Stated ; Code:   0272536644534-270-4316 ; Contributor System:   PowerChart ; Last Updated:   07/01/2020 9:42 EDT ; Life Cycle Date:   07/01/2020 ; Life Cycle Status:   Active ; Vocabulary:   SNOMED CT        Migraines (SNOMED CT  :0347425963055014 )  Name of Problem:   Migraines ; Recorder:   Maudie FlakesBaden, Charity fundraiserN, Neldon Newportana G; Confirmation:   Confirmed ; Classification:   Patient Stated ; Code:  [id number] ; Contributor System:   PowerChart ; Last Updated:   07/01/2020 9:43 EDT ; Life Cycle Date:   07/01/2020 ; Life Cycle Status:   Active ; Vocabulary:   SNOMED CT          Procedure History        -    Procedure History   (As Of: 07/05/2020 07:43:38 EDT)     Procedure Dt/Tm:   2010 ; Anesthesia Minutes:   0 ; Procedure Name:   CESAREAN SECTION AND TUBAL LIGATION ;  Procedure Minutes:   0 ; Last Reviewed Dt/Tm:   07/05/2020 07:42:49 EDT            Procedure Dt/Tm:   2010 ; Anesthesia Minutes:   0 ; Procedure Name:   Cholecystectomy ; Procedure Minutes:   0 ; Last Reviewed Dt/Tm:   07/05/2020 07:42:49 EDT            Procedure Dt/Tm:   2017 ; Anesthesia Minutes:   0 ; Procedure Name:   Hysterectomy ; Procedure Minutes:   0 ; Last Reviewed Dt/Tm:   07/05/2020 07:42:49 EDT            Procedure Dt/Tm:   2021 ; Anesthesia Minutes:   0 ; Procedure Name:   EGD - Esophagogastroduodenoscopy ; Procedure Minutes:   0 ; Last Reviewed Dt/Tm:   07/05/2020 07:42:49 EDT            Procedure Dt/Tm:   2008 ; Anesthesia Minutes:   0 ; Procedure Name:   Tonsillectomy ; Procedure Minutes:   0 ; Last Reviewed Dt/Tm:   07/05/2020 07:42:49 EDT            History Confirmation   Problem History Changes PAT :   No   Procedure History Changes PAT :   No   JONES, RN, Levander Campion - 07/05/2020 7:41 EDT   Anesthesia/Sedation   Anesthesia History :   Prior general anesthesia   SN - Malignant Hyperthermia :   Denies   Previous Problem with Anesthesia :   None   Moderate Sedation History :   Prior sedation for procedure   Previous Problem With Sedation :   None   Symptoms of Sleep Apnea :   BMI greater than 35   Symptoms of Sleep Apnea Score (STOP BANG) :   1    Shortness of Breath Indicator :   No shortness of breath   Joette Catching - 07/01/2020 9:35 EDT   Bloodless Medicine   Is Blood Transfusion Acceptable to Patient :   Yes   Yetta Barre, RN, Levander Campion - 07/05/2020 7:41 EDT   ID Risk Screen Symptoms   Recent Travel History :   No recent travel   Close Contact with COVID-19 ID :   Preadmission testing patients only   Last 14 days COVID-19 ID :   No   TB Symptom Screen :   No symptoms   C. diff Symptom/History ID :   Neither of the above   [place], Programme researcher, broadcasting/film/video - 07/01/2020 9:35 EDT   ID COVID-19 Screen   Fever OR Chills :   No   Headache :   No   New or Worsening Cough :   No   Fatigue :   No   Shortness of Breath ID :   No    Myalgia (Muscle Pain) :   No   Dyspnea :   No   Diarrhea :  No   Sore Throat :   No   Nausea :   No   Laryngitis :   No   Sudden Loss of Taste or Smell :   No   Joette Catching - 07/01/2020 9:35 EDT   Social History   Social History   (As Of: 07/05/2020 07:43:38 EDT)   Tobacco:        Tobacco use: Never (less than 100 in lifetime).   (Last Updated: 07/01/2020 09:40:41 EDT by Maudie Flakes, RN, Neldon Newport)          Electronic Cigarette/Vaping:        Never Electronic Cigarette Use.   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

## 2020-07-05 NOTE — Assessment & Plan Note (Signed)
Pre-Procedure Record - [facility]             Pre-Procedure Record - SFEND Summary                                                            Primary Physician:        Markus Daft    Case Number:              [id number]    Finalized Date/Time:      07/05/20 07:51:26    Pt. Name:                 Theresa Leonard, Theresa Leonard    D.O.B./Sex:               March 19, 1976    Female    Med Rec #:                [id number]    Physician:                Markus Daft    Financial #:              [id number]    Pt. Type:                 S    Room/Bed:                 /    Admit/Disch:              07/05/20 06:56:00 -    Institution:       [facility] Case Attendance - Pre-Procedure                                                                                     Entry 1                         Entry 2                         Entry 3                                          Case Attendee             DAYEMO,  Merlyn Albert, RN, Lisette Abu    Role Performed            Surgeon Primary                 Preoperative Nurse              PCT    Time In     Time Out  Last Modified By:         Yetta Barre, RN, Margarita Sermons, RN, Margarita Sermons, RN, [facility]                              07/05/20 07:32:19               07/05/20 07:36:10               07/05/20 07:36:10      SFEND Case Attendance - Pre-Procedure Audit                                                      07/05/20 07:36:10         Owner: [facility]                               Modifier: BRAMSH                                                        <+> 2         Case Attendee        <+> 2         Role Performed        <+> 3         Case Attendee        <+> 3         Role Performed        SFEND Case Times - Pre-Procedure                                                                                          Entry 1                                                                                                           Patient In Room Time      07/05/20 07:07:00               Nurse In Time                   07/05/20 07:36:00    Nurse Out  Time            07/05/20 07:51:00               Patient Ready for               07/05/20 07:51:00                                                              Surgery/Procedure     Last Modified By:         Ala Bent                              07/05/20 07:51:23      SFEND Case Times - Pre-Procedure Audit                                                           07/05/20 07:51:23         Owner: Kari Baars                               Modifier: BRAMSH                                                        <+> 1         Patient Ready for Surgery/Procedure        <+> 1         Nurse Out Time                Finalized By: Ala Bent      Document Signatures                                                                             Signed By:           Ala Bent 07/05/20 07:51

## 2020-07-05 NOTE — Anesthesia Pre-Procedure Evaluation (Signed)
Preanesthesia Evaluation        Patient:   Theresa Leonard, Theresa Leonard            MRN: [id number]            FIN: [id number]               Age:   45 years     Sex:  Female     DOB:  [birth year]   Associated Diagnoses:   None   Author:   Sherlyn Hay W-MD      Preoperative Information   NPO:  NPO greater than 8 hours.    Anesthesia history     Patient's history: negative.     Family's history: negative.        Health Status   Allergies:    Allergic Reactions (Selected)  No Known Medication Allergies,    Allergies    (Active and Proposed Allergies Only)  No Known Medication Allergies   (Severity: Unknown severity, Onset: Unknown)     Current medications:    Home Medications (5) Active  azelastine-fluticasone (Dymista) 137 mcg-50 mcg/inh nasal spray 1 sprays, PRN, Nasal, BID  Dexilant 60 mg oral delayed release capsule 60 mg = 1 caps, Oral, qPM  Refresh Dry Eye Therapy 1 drops, PRN, Eye-Both, QID  Vitamin D3 50,000 intl units oral capsule 50,000 units, Oral, qWeek  Xyzal 5 mg oral tablet 5 mg = 1 tabs, Oral, qPM  ,    Medications (3) Active  Scheduled: (0)  Continuous: (1)  Lactated Ringers Injection solution 1,000 mL  1,000 mL, IV, 30 mL/hr  PRN: (2)  lidocaine 1% PF Inj Soln 2 mL  0.25 mL, ID, q19min  sodium chloride 0.9% Inj Soln 10 mL syringe  30 mL, IV Push, q47min     Problem list:    Active Problems (4)  Acid reflux   Eczema   Environmental and seasonal allergies   Migraines   ,    Problems   (Active Problems Only)    Allergic disposition   (SNOMED CT: 9528413244, Onset: --)  Gastroesophageal reflux disease   (SNOMED CT: 010272536, Onset: --)  Migraine   (SNOMED CT: 64403474, Onset: --)  Eczema   (SNOMED CT: 25956387, Onset: --)        Histories   Past Medical History:    No active or resolved past medical history items have been selected or recorded.   Procedure history:    EGD - Esophagogastroduodenoscopy (5643329518) in 2021 at 44 Years.  Hysterectomy (841660630) in 2017 at 40 Years.  Cholecystectomy (16010932) in 2010  at 33 Years.  CESAREAN SECTION AND TUBAL LIGATION in 2010 at 33 Years.  Tonsillectomy (355732202) in 2008 at 31 Years.   Social History        Social & Psychosocial Habits    Alcohol  07/01/2020  Use: Denies    Substance Use  07/01/2020  Opioid Assessment Opioid Naive-not taking    Use: Denies    07/01/2020  Use: Current    Frequency: 3-5 times per week    Type: Cannabinoid (CBD)    Comment: CBD GUMMIES - 07/01/2020 09:41 - Maudie Flakes, RN, Neldon Newport    Tobacco  07/01/2020  Use: Never (less than 100 in l    Electronic Cigarette/Vaping  07/01/2020  Electronic Cigarette Use: Never  .     Symptoms of Sleep Apnea Score: PAT Documentation: 1  06/29/2020 12:09 EDT     PONV Risk Score: PAT Documentation: 3                        07/01/2020 9:47 EDT        Physical Examination   Vital Signs   07/05/2020 7:07 EDT Systolic Blood Pressure 152 mmHg  HI    Diastolic Blood Pressure 87 mmHg    Temperature Oral 36.7 degC    Peripheral Pulse Rate 75 bpm    Respiratory Rate 18 br/min    SpO2 100 %    SBP/DBP Cuff Details Right arm, Automated         Vital Signs (last 24 hrs)_____  Last Charted___________  Temp Oral     36.7 degC  (MAR 22 07:07)  Heart Rate Peripheral   75 bpm  (MAR 22 07:07)  Resp Rate         18 br/min  (MAR 22 07:07)  SBP      H  (MAR 22 07:07)  DBP      87 mmHg  (MAR 22 07:07)  SpO2      100 %  (MAR 22 07:07)  Weight      132.3 kg  (MAR 22 07:07)  Height      157.5 cm  (MAR 22 07:07)  BMI      53.33  (MAR 22 07:12)     Measurements from flowsheet : Measurements   07/05/2020 7:12 EDT Body Mass Index est meas 53.33 kg/m2    Body Mass Index Measured 53.33 kg/m2   07/05/2020 7:07 EDT Height/Length Measured 157.5 cm    Weight Measured 132.3 kg    Weight Dosing 132.3 kg    Dosing Weight Difference 0 %      Pain assessment:  Pain Assessment   07/05/2020 7:43 EDT       Numeric Rating Pain Scale 0 = No pain     .    General:          Stress: No acute distress.         Appearance: Within normal limits.     Airway:          Mallampati classification: II (soft palate, fauces, uvula visible).         Mouth: Teeth ( Within normal limits ).    Neck:  Full range of motion.    Neurologic:  Alert.       Review / Management   Results review:     No qualifying data available.       Assessment and Plan   American Society of Anesthesiologists#(ASA) physical status classification:  Class II.    Anesthetic Preoperative Plan     Anesthetic technique: Monitored anesthesia care.     Risks discussed: nausea, vomiting, dental injury, hypotension, allergic reaction, serious complications.     Signature Line     Electronically Signed on 07/05/2020 08:26 AM EDT   ________________________________________________   Sherlyn Hay W-MD

## 2020-07-05 NOTE — Procedures (Signed)
Procedure Record - [facility]             Procedure Record - SFEND Summary                                                                Primary Physician:        Eduardo Osier    Case Number:              [id number]    Finalized Date/Time:      07/05/20 10:18:12    Pt. Name:                 Theresa Leonard, Theresa Leonard    D.O.B./Sex:               09/26/1975    Female    Med Rec #:                [id number]    Physician:                Eduardo Osier    Financial #:              [id number]    Pt. Type:                 S    Room/Bed:                 /    Admit/Disch:              07/05/20 06:56:00 -    Institution:       [facility] - Case Times                                                                                                        Entry 1                                                                                                          Patient      In Room Time             07/05/20 08:52:00               Out Room Time                   07/05/20 09:11:00    Anesthesia     Procedure      Start Time  07/05/20 08:56:00               Stop Time                       07/05/20 09:09:00    Last Modified By:         Clotilde Dieter Preston, ERICA C                              07/05/20 09:11:34      SFEND - Case Times Audit                                                                         07/05/20 09:11:34         Owner: Majel Homer                               Modifier: CROSER                                                        <+> 1         Out Room Time     07/05/20 09:10:00         Owner: Majel Homer                               Modifier: CROSER                                                        <+> 1         Stop Time     07/05/20 08:56:12         Owner: Majel Homer                               Modifier: CROSER                                                        <+> 1         Start Time        SFEND - Case Attendance  Entry 1                         Entry 2                         Entry 3                                          Case Attendee             Raymond,  Tomma Rakers, RN, ERICA Susa Griffins    Role Performed            Surgeon Primary                 Circulator                      Endoscopy Technician    Time In                   07/05/20 08:52:00               07/05/20 08:52:00               07/05/20 08:52:00    Time Out     Procedure                 Colonoscopy,                    Colonoscopy,                    Colonoscopy,                              Colonoscopy with Polyp          Colonoscopy with Polyp          Colonoscopy with Polyp                              Cold                            Cold                            Cold    Last Modified By:         Clotilde Dieter RN, [place], RN, [place], RN, ERICA C                              07/05/20 09:08:03               07/05/20 09:08:03               07/05/20 09:08:03  Entry 4                         Entry 5                                                                          Case Attendee             HOLT,  HILLARY-CRNA             Lucina Mellow W-MD    Role Performed            CRNA                            Anesthesiologist    Time In                   07/05/20 08:52:00               07/05/20 08:52:00    Time Out     Procedure                 Colonoscopy,                    Colonoscopy,                              Colonoscopy with Polyp          Colonoscopy with Polyp                              Cold                            Cold    Last Modified By:         Clotilde Dieter RN, [place], RN, ERICA C                              07/05/20 09:08:03               07/05/20 09:08:03      SFEND - Case Attendance Audit                                                                    07/05/20 09:08:03         Owner: Majel Homer                                Modifier: CROSER  1     <*> Procedure                              Colonoscopy            2     <+> Time In            2     <*> Procedure                              Colonoscopy            3     <+> Time In            3     <*> Procedure                              Colonoscopy            4     <+> Time In            4     <*> Procedure                              Colonoscopy            5     <+> Time In            5     <*> Procedure                              Colonoscopy     07/05/20 08:54:49         Owner: CROSER                               Modifier: CROSER                                                            1     <+> Time In            1     <*> Procedure                              Colonoscopy        <+> 2         Case Attendee        <+> 2         Role Performed        <+> 2         Procedure        <+> 3         Case Attendee        <+> 3         Role Performed        <+> 3         Procedure        <+> 4         Case Attendee        <+>  4         Role Performed        <+> 4         Procedure        <+> 5         Case Attendee        <+> 5         Role Performed        <+> 5         Procedure        SFEND - Patient Positioning                                                                     Pre-Care Text:            A.240 Assesses baseline skin condition  A.280 Identifies baseline musculoskeletal status  A.280.1 Identifies           physical alterations that require additional precautions for procedure-specific positioning  A.510.8 Maintains           patient's dignity and privacy  Im.120 Implements protective measures to prevent skin/tissue injury due to           mechanical sources  Im.40 Positions the patient  Im.80 Applies safety devices                              Entry 1                                                                                                          Procedure                 Colonoscopy                      Body Position                   Lateral Right Up    Left Arm Position         Resting at Side                 Right Arm Position              Resting at Side    Left Leg Position         Extended                        Right Leg Position              Extended    Feet Uncrossed            Yes  Pressure Points                 Yes                                                              Checked     Positioning Device        Pillow                          Positioned By                   CROSBY, RN, ERICA C,                                                                                              HOLT,  HILLARY-CRNA    Outcome Met (O.80)        Yes    Last Modified By:         Clotilde Dieter, RN, ERICA C                              07/05/20 08:55:45    Post-Care Text:            E.10 Evaluates for signs and symptoms of physical injury to skin and tissue  E.290 Evaluates musculoskeletal           status  O.80 Patient is free from signs and symptoms of injury related to positioning  O.120 the patient is           free from signs and symptoms of injury related to transfer/transport   O.250 Patient's musculoskeletal status           is maintained at or improved from baseline levels      SFEND - Time Out - Procedure                                                                    Pre-Care Text:            A.10 Confirms patient identity  A.20 Verifies operative procedure, surgical site, and laterality  A.20.1           Verifies consent for planned procedure  A.30 Verifies allergies                              Entry 1  Procedure                 Colonoscopy                     Patient name and                Yes                                                              DOB confirmed     Surgical procedure        Yes                             Correct surgical                No     to be performed                                           site marked and     confirmed and                                             initials are     verified by                                               visible through     completed surgical                                        prepped and draped     consent                                                   field (or                                                               alternative ID band                                                               used), if applicable     Allergies discussed       Yes  Anticoagulation                 Yes                                                              status confirmed     Time Out Complete         07/05/20 08:54:00    Last Modified By:         Clotilde Dieter RN, ERICA C                              07/05/20 08:55:00    Post-Care Text:            E.30 Evaluates verification process for correct patient, site, side, and level surgery      SFEND - Debrief - Procedure                                                                     Pre-Care Text:            Im.330 Manages specimen handling and dispositio                              Entry 1                                                                                                          Procedure                 Colonoscopy,                    Actual procedure                Yes                              Colonoscopy with Polyp          performed confirmed                               Cold    Confirm specimens         Yes                             Patient recovery                Yes  and specimens                                             plan confirmed     labeled     appropriately (if     applicable)     Debrief Complete          07/05/20 09:10:00    Last Modified By:         Clotilde Dieter RN, ERICA C                              07/05/20 09:10:07    Post-Care Text:            E.800 Ensures continuity of care  E.50  Evaluates results of the surgical count  O.30 Patient's procedure is           performed on the correct site, side, and level  O.50 patient's current status is communicated throughout the           continuum of care  O.40 Patient's specimen(s) is managed in the appropriate manner      SFEND - General Case Data                                                                                                 Entry 1                                                                                                          Case Information      ASA Class                2                               Case Level                      Level 2     OR                       SF Endo 02                      Specialty                       Gastroenterology (SN)     Wound Class  No Incision    Preop Diagnosis           Z12.11                          Postop Diagnosis                Z12.11    Last Modified By:         Merlene Laughter                              07/05/20 10:18:10      SFEND - General Case Data Audit                                                                  07/05/20 10:18:10         Owner: Majel Homer                               Modifier: [email or web address]                                                            1     <*> Case Level                             Level 1        SFEND - Procedures                                                                                                        Entry 1                         Entry 2                                                                          Procedure     Description      Procedure                Colonoscopy                     Colonoscopy with Polyp  Cold     Modifiers      Surgical Procedure       COLONOSCOPY                     COLONOSCOPY     Text     Primary Procedure         Yes                             No    Primary Surgeon           Gerlene Fee   [facility]    Start                     07/05/20 08:56:00               07/05/20 08:56:00    Stop                      07/05/20 09:09:00               07/05/20 09:09:00    Anesthesia Type           Monitored Anesthesia            Monitored Anesthesia                              Care                            Care    Surgical Service          Gastroenterology (SN)           Gastroenterology (SN)    Wound Class               No Incision                     No Incision    Last Modified By:         Clotilde Dieter RN, [place], RN, ERICA C                              07/05/20 09:10:13               07/05/20 09:10:13      SFEND - Procedures Audit                                                                         07/05/20 09:10:13         Owner: Majel Homer                               Modifier: CROSER                                                        <+>  1         Stop        <+> 2         Stop        SFEND - Carbon Dioxide Insufflation                                                                                       Entry 1                                                                                                          Procedure                 Colonoscopy                     Carbon Dioxide                  REGULATOR CO2 ON WALL                                                              Insufflator     Insufflation Mode         Managed Flow                    Flow Rate                       5 L/min    Last Modified By:         Clotilde Dieter RN, ERICA C                              07/05/20 08:55:19      [place] [facility]oscopy Detail                                                                                            Entry 1  Colonoscopy     Completion Details      Cecum Reached            Yes                             Cecum Reached                   07/05/20 08:59:00     Withdrawal  Time          07/05/20 09:09:00    Hemorrhoid     Treatment Details     Last Modified By:         Clotilde Dieter RN, ERICA C                              07/05/20 09:09:55      [place] [facility] Detail Audit                                                             07/05/20 09:09:55         Owner: Majel Homer                               Modifier: CROSER                                                        <+> 1         Withdrawal Time     07/05/20 08:59:39         Owner: Majel Homer                               Modifier: CROSER                                                            1     <-> Withdrawal Time                        07/05/20 08:59:00            1     <+> Cecum Reached        SFEND - Transfer  Entry 1                                                                                                          Transferred By            Clotilde Dieter, RN, ERICA C,            Via                             Stretcher                              HOLT,  HILLARY-CRNA    Post-op Destination       PACU    Skin Assessment      Condition                Intact    Last Modified By:         Clotilde Dieter RN, ERICA C                              07/05/20 08:55:54      SFEND - Specimens                                                                               Pre-Care Text:            A.20 Verifies operative procedure, surgical site, and laterality  Im.320 Manages culture specimen collection           Im.330 Manages specimen handling and disposition                              Entry 1                                                                                                          Description               a. descending colon                              polyp bx -  cold forcep    Last Modified By:         Clotilde Dieter RN, ERICA C                              07/05/20 09:08:30    Post-Care Text:            E.40 Evaluates  correct processes have been performed for specimen handling and disposition  O.40 Patient's           specimen(s) is managed in the appropriate manner      Case Comments                                                                                         <None>              Finalized By: Merlene Laughter      Document Signatures                                                                             Signed By:           Clotilde Dieter RN, ERICA C 07/05/20 09:11          Xaver.Mink,  JENNIFER 07/05/20 10:18      Unfinalized History                                                                                     Date/Time            Username    Reason for Unfinalizing         Freetext Reason for Unfinalizing                                          07/05/20 10:18       [place]

## 2020-07-05 NOTE — Op Note (Signed)
Procedure Report    [facility], MD  Service Date: 07/05/2020    PROCEDURE:  Colonoscopy with biopsy.    INDICATION:  Screening colonoscopy.    COMPLICATIONS:  None.    MEDICATIONS:  Propofol per anesthesiologist.    SUMMARY OF PROCEDURE:  Informed consent was obtained.  The patient was  placed in left lateral decubitus position.  Digital rectal examination  did not reveal any rectal mass.  Colonoscope was introduced to rectum,  advanced to cecum.  Ileocecal valve and appendiceal orifice were seen  as landmark for intubation of the cecum.  Cecum was free of polyp,  mass or diverticulosis.  Colonoscope was withdrawn back to ascending  colon, which revealed few diverticulosis.  Hepatic flexure was normal.   Transverse colon was normal.  Descending colon showed a small polyp  measuring about 2 mm.  The polyp was removed by cold biopsy and  submitted for pathology.  Sigmoid colon showed a few diverticulosis.   Retroflexed examination at the rectum was normal.  Colonoscope was  placed in neutral position and withdrawn.  No immediate complications  were noted.    ASSESSMENT:  1.  Colonic diverticulosis.  2.  Diminutive colonic polyp.    PLAN:  Repeat colonoscopy based on pathology report.      Grayland Ormond, MD  TR: tn DD: 07/05/2020 09:15 TD: 07/05/2020 09:25 Job#: [id number]  \\[id number]\\DOC#: [id number]  \\[id number]\\  Signature Line    Electronically Signed on 07/20/2020 02:18 PM EDT  ________________________________________________  Markus Daft

## 2020-07-05 NOTE — Discharge Summary (Signed)
Inpatient Patient Summary              [facility] Emergency Department  [address] [place] [address]  [phone]  Discharge Instructions (Patient)  Name: Theresa Leonard, Theresa Leonard  DOB: [birth year]                   MRN: [id number]                   FIN: [phone]  Reason For Visit: Z12.11  Final Diagnosis: Colon polyp     Visit Date: 06/28/2020 10:49:23  Address: [address] [place] [address]  Phone: [phone]     Emergency Department Providers:         Primary Physician:      None      [facility] would like to thank you for allowing [place] to assist you with your healthcare needs. The following includes patient education materials and information regarding your injury/illness.     Follow-up Instructions:  You were seen today on an emergency basis. Please contact your primary care doctor for a follow up appointment. If you received a referral to a specialist doctor, it is important you follow-up as instructed.    It is important that you call your follow-up doctor to schedule and confirm the location of your next appointment. Your doctor may practice at multiple locations. The office location of your follow-up appointment may be different to the one written on your discharge instructions.    If you do not have a primary care doctor, please call [phone] for help in finding a [facility] Provider. For help in finding a specialist doctor, please call [phone].    If your condition gets worse before your follow-up with your primary care doctor or specialist, please return to the Emergency Department.      Coronavirus 2019 (COVID-19) Reminders:     Patients age 30 - 11, with parental consent, and patients over age 46 can make an appointment for a COVID-19 vaccine. Patients can contact their [facility] doctors' offices to schedule an appointment to receive the COVID-19 vaccine. Patients who do not have a [facility]  physician can call [phone] to schedule vaccination appointments.      Follow Up Appointments:  Primary Care Provider:     Name: Markus Daft     Phone: [phone]                 With: Address: When:   Grayland Ormond [address] [place], [place] [address]  [phone] Business (1) Within 1 month              [facility] SERVICES%>  Discharge Service Agency Selected  DISCHARGE SERVICE AGENCY SELECTED%>     DME Agency  DME AGENCY%>             Medications that have not changed  Other Medications  azelastine-fluticasone nasal (azelastine-fluticasone (Dymista) 137 mcg-50 mcg/inh nasal spray) 1 Sprays Nasal (into the nose) 2 times a day as needed allergic rhinitis/nasal congestion.  Last Dose:____________________  cholecalciferol (Vitamin D3 50,000 intl units oral capsule) 50,000 Units Oral (given by mouth) every week.  Last Dose:____________________  dexlansoprazole (Dexilant 60 mg oral delayed release capsule) 1 Capsules Oral (given by mouth) once a day (in the evening).  Last Dose:____________________  levocetirizine (Xyzal 5 mg oral tablet) 1 Tabs Oral (given  by mouth) once a day (in the evening).  Last Dose:____________________  ocular lubricant (Refresh Dry Eye Therapy) 1 Drops Both eyes 4 times a day as needed dry eyes.  Last Dose:____________________      Allergy Info: No Known Medication Allergies     Discharge Additional Information          Discharge Patient 07/05/20 9:13:00 EDT      Patient Education Materials:       ---------------------------------------------------------------------------------------------------------------------  [facility] allows patients to review your COVID and other test results as well as discharge documents from any [facility], Emergency Department, surgical center or outpatient lab. Test results are typically available 36 hours after the test is completed.     [facility] encourages  you to self-enroll in the [facility] Patient Portal.     To begin your self-enrollment process, please visit [email or web address] Under [facility], click on ???Sign up now???.     NOTE: You must be 16 years and older to use [facility] Self-Enroll online. If you are a parent, caregiver, or guardian; you need an invite to access your child???s or dependent???s health records. To obtain an invite, contact the Medical Records department at [phone] Monday through Friday, 8-4:30, select option 3 . If we receive your call afterhours, we will return your call the next business day.     If you have issues trying to create or access your account, contact [employer] support at [phone] available 7 days a week 24 hours a day.     Comment:

## 2020-07-05 NOTE — Op Note (Signed)
Phase II Record - SFEND             Phase II Record - SFEND Summary                                                                 Primary Physician:        Markus Daft    Case Number:              [id number]    Finalized Date/Time:      07/05/20 09:36:28    Pt. Name:                 Theresa Leonard, Theresa Leonard    D.O.B./Sex:               26-Dec-1975    Female    Med Rec #:                [id number]    Physician:                Markus Daft    Financial #:              [id number]    Pt. Type:                 S    Room/Bed:                 /    Admit/Disch:              07/05/20 06:56:00 -    Institution:       [facility] Case Attendance - Phase II                                                                                          Entry 1                                                                                                          Case Attendee             Gretta Arab                Role Performed                  Phase II Nurse    Time In                   07/05/20 09:14:00    Last Modified By:  Gretta Arab                              07/05/20 09:21:18      SFEND - Case Times - Phase II                                                                                             Entry 1                                                                                                          Phase II In               07/05/20 09:14:00               Phase II Out                    07/05/20 09:36:00    Phase II Discharge        07/05/20 09:36:00    Time     Last Modified ByGretta Arab                              07/05/20 09:36:16      SFEND - Case Times - Phase II Audit                                                              07/05/20 09:36:16         Owner: [id number]                            Modifier: [id number]                                                     <+> 1         Phase II Out        <+> 1         Phase II Discharge Time                Finalized By:  Gretta Arab      Document Signatures  Signed By:           Gretta Arab 07/05/20 09:36

## 2020-07-05 NOTE — Discharge Summary (Signed)
Inpatient Clinical Summary             [facility]  Post-Acute Care Transfer Instructions  PERSON INFORMATION   Name: Theresa Leonard, Theresa Leonard  MRN: [id number]    FIN#: [id number]   PHYSICIANS  Admitting Physician: Markus Daft  Attending Physician: Markus Daft   PCP: Markus Daft  Discharge Diagnosis:  Colon polyp  Comment:       PATIENT EDUCATION INFORMATION  Instructions:               Medication Leaflets:               Follow-up:                           With: Address: When:   [facility] Maksim Peregoy [address] [place], [place] [address]  [phone] Business (1) Within 1 month                             MEDICATION LIST  Medication Reconciliation at Discharge:          Medications that have not changed  Other Medications  azelastine-fluticasone nasal (azelastine-fluticasone (Dymista) 137 mcg-50 mcg/inh nasal spray) 1 Sprays Nasal (into the nose) 2 times a day as needed allergic rhinitis/nasal congestion.  Last Dose:____________________  cholecalciferol (Vitamin D3 50,000 intl units oral capsule) 50,000 Units Oral (given by mouth) every week.  Last Dose:____________________  dexlansoprazole (Dexilant 60 mg oral delayed release capsule) 1 Capsules Oral (given by mouth) once a day (in the evening).  Last Dose:____________________  levocetirizine (Xyzal 5 mg oral tablet) 1 Tabs Oral (given by mouth) once a day (in the evening).  Last Dose:____________________  ocular lubricant (Refresh Dry Eye Therapy) 1 Drops Both eyes 4 times a day as needed dry eyes.  Last Dose:____________________         Patient???s Final Home Medication List Upon Discharge:           azelastine-fluticasone nasal (azelastine-fluticasone (Dymista) 137 mcg-50 mcg/inh nasal spray) 1 Sprays Nasal (into the nose) 2 times a day as needed allergic rhinitis/nasal congestion.  cholecalciferol (Vitamin D3 50,000 intl units oral capsule) 50,000 Units Oral (given by mouth) every week.  dexlansoprazole (Dexilant 60 mg oral  delayed release capsule) 1 Capsules Oral (given by mouth) once a day (in the evening).  levocetirizine (Xyzal 5 mg oral tablet) 1 Tabs Oral (given by mouth) once a day (in the evening).  ocular lubricant (Refresh Dry Eye Therapy) 1 Drops Both eyes 4 times a day as needed dry eyes.         Comment:       ORDERS          Order Name Order Details   Discharge Patient 07/05/20 9:13:00 EDT

## 2020-08-11 LAB — N TERMINAL PROBNP (AKA NTPROBNP): NT Pro-BNP: <50 pg/mL (ref 0–125)

## 2020-08-11 LAB — D-DIMER, QUANTITATIVE: D-Dimer, Quant: 0.53 mcg/mL FEU — ABNORMAL HIGH (ref 0.19–0.51)

## 2020-08-11 LAB — BASIC METABOLIC PANEL
Anion Gap: 9 mmol/L (ref 2–17)
BUN: 7 mg/dL (ref 6–20)
CO2: 27 mmol/L (ref 22–29)
Calcium: 9.2 mg/dL (ref 8.6–10.0)
Chloride: 105 mmol/L (ref 98–107)
Creatinine: 0.9 mg/dL (ref 0.5–1.0)
GFR African American: 90 mL/min/{1.73_m2} (ref >=90)
GFR Non-African American: 77 mL/min/{1.73_m2} — ABNORMAL LOW (ref >=90)
Glucose: 107 mg/dL — ABNORMAL HIGH (ref 70–99)
OSMOLALITY CALCULATED: 280 mOsm/kg (ref 270–287)
Potassium: 4.2 mmol/L (ref 3.5–5.3)
Sodium: 141 mmol/L (ref 135–145)

## 2020-08-11 LAB — CBC WITH AUTO DIFFERENTIAL
Absolute Baso #: 0 10*3/uL (ref 0.0–0.2)
Absolute Eos #: 0.2 10*3/uL (ref 0.0–0.5)
Absolute Lymph #: 1.8 10*3/uL (ref 1.0–3.2)
Absolute Mono #: 0.7 10*3/uL (ref 0.3–1.0)
Basophils %: 0.3 % (ref 0.0–2.0)
Eosinophils %: 2.9 % (ref 0.0–7.0)
Hematocrit: 41.1 % (ref 34.0–47.0)
Hemoglobin: 13.6 g/dL (ref 11.5–15.7)
Immature Grans (Abs): 0.03 10*3/uL (ref 0.00–0.06)
Immature Granulocytes: 0.4 % (ref 0.0–0.6)
Lymphocytes: 22.2 % (ref 15.0–45.0)
MCH: 27.8 pg (ref 27.0–34.5)
MCHC: 33.1 g/dL (ref 32.0–36.0)
MCV: 84 fL (ref 81.0–99.0)
MPV: 10.9 fL (ref 7.2–13.2)
Monocytes: 8.2 % (ref 4.0–12.0)
Neutrophils %: 66 % (ref 42.0–74.0)
Neutrophils Absolute: 5.2 10*3/uL (ref 1.6–7.3)
Platelets: 212 10*3/uL (ref 140–440)
RBC: 4.89 x10e6/mcL (ref 3.60–5.20)
RDW: 14.4 % (ref 11.0–16.0)
WBC: 7.9 10*3/uL (ref 3.8–10.6)

## 2020-08-11 LAB — TROPONIN T
Troponin T: <0.01 ng/mL (ref 0.000–0.010)
Troponin T: <0.01 ng/mL (ref 0.000–0.010)

## 2020-08-11 NOTE — ED Notes (Signed)
ED Patient Education Note     Patient Education Materials Follows:  Pulmonary Medicine     Nonspecific Chest Pain, Adult    Chest pain can be caused by many different conditions. It can be caused by a condition that is life-threatening and requires treatment right away. It can also be caused by something that is not life-threatening. If you have chest pain, it can be hard to know the difference, so it is important to get help right away to make sure that you do not have a serious condition.    Some life-threatening causes of chest pain include:   Heart attack.     A tear in the body's main blood vessel (aortic dissection).     Inflammation around your heart (pericarditis).     A problem in the lungs, such as a blood clot (pulmonary embolism) or a collapsed lung (pneumothorax).      Some non life-threatening causes of chest pain include:   Heartburn.     Anxiety or stress.     Damage to the bones, muscles, and cartilage that make up your chest wall.     Pneumonia or bronchitis.     Shingles infection (varicella-zoster virus).      Chest pain can feel like:   Pain or discomfort on the surface of your chest or deep in your chest.     Crushing, pressure, aching, or squeezing pain.     Burning or tingling.     Dull or sharp pain that is worse when you move, cough, or take a deep breath.     Pain or discomfort that is also felt in your back, neck, jaw, shoulder, or arm, or pain that spreads to any of these areas.      Your chest pain may come and go. It may also be constant. Your health care provider will do lab tests and other studies to find the cause of your pain. Treatment will depend on the cause of your chest pain.      Follow these instructions at home:    Medicines     Take over-the-counter and prescription medicines only as told by your health care provider.     If you were prescribed an antibiotic, take it as told by your health care provider. Do not stop taking the antibiotic even if you start to feel better.       Lifestyle       Rest as directed by your health care provider.     Do not use any products that contain nicotine or tobacco, such as cigarettes and e-cigarettes. If you need help quitting, ask your health care provider.     Do not drink alcohol.     Make healthy lifestyle choices as recommended. These may include:  ? Getting regular exercise. Ask your health care provider to suggest some activities that are safe for you.    ? Eating a heart-healthy diet. This includes plenty of fresh fruits and vegetables, whole grains, low-fat (lean) protein, and low-fat dairy products. A dietitian can help you find healthy eating options.    ? Maintaining a healthy weight.    ? Managing any other health conditions you have, such as high blood pressure (hypertension) or diabetes.    ? Reducing stress, such as with yoga or relaxation techniques.        General instructions     Pay attention to any changes in your symptoms. Tell your health care provider about them or any   new symptoms.     Avoid any activities that cause chest pain.     Keep all follow-up visits as told by your health care provider. This is important. This includes visits for any further testing if your chest pain does not go away.        Contact a health care provider if:     Your chest pain does not go away.     You feel depressed.     You have a fever.      Get help right away if:     Your chest pain gets worse.     You have a cough that gets worse, or you cough up blood.     You have severe pain in your abdomen.     You faint.     You have sudden, unexplained chest discomfort.     You have sudden, unexplained discomfort in your arms, back, neck, or jaw.     You have shortness of breath at any time.     You suddenly start to sweat, or your skin gets clammy.     You feel nausea or you vomit.     You suddenly feel lightheaded or dizzy.     You have severe weakness, or unexplained weakness or fatigue.     Your heart begins to beat quickly, or it feels like it is  skipping beats.    These symptoms may represent a serious problem that is an emergency. Do not wait to see if the symptoms will go away. Get medical help right away. Call your local emergency services (911 in the [place] Do not drive yourself to the hospital.      Summary     Chest pain can be caused by a condition that is serious and requires urgent treatment. It may also be caused by something that is not life-threatening.     If you have chest pain, it is very important to see your health care provider. Your health care provider may do lab tests and other studies to find the cause of your pain.     Follow your health care provider's instructions on taking medicines, making lifestyle changes, and getting emergency treatment if symptoms become worse.     Keep all follow-up visits as told by your health care provider. This includes visits for any further testing if your chest pain does not go away.      This information is not intended to replace advice given to you by your health care provider. Make sure you discuss any questions you have with your health care provider.      Document Revised: 10/03/2017 Document Reviewed: 10/03/2017  Elsevier Patient Education ? 2021 [employer] Inc[employer]

## 2020-08-11 NOTE — ED Provider Notes (Signed)
Chest pain        Patient:   Theresa Leonard, Theresa Leonard            MRN: [id number]            FIN: [id number]               Age:   45 years     Sex:  Female     DOB:  1976-03-01   Associated Diagnoses:   Atypical chest pain   Author:   Precious Gilding C-MD      Basic Information   Time seen: Provider Seen (ST)   ED Provider/Time:    Ysidra Sopher,  Ha Placeres C-MD / 08/11/2020 10:47  .   Additional information: Chief Complaint from Nursing Triage Note   Chief Complaint  Chief Complaint: Pt c/o intermitent "tightness" to her left arm for 2 days (08/11/20 10:57:00).      History of Present Illness   The patient presents with chest pain.  The onset was 2  days ago.  The course/duration of symptoms is fluctuating in intensity.  Location: Left arm. Radiating pain: none. The character of symptoms is heaviness and tightness.  The degree at onset was moderate.  The degree at maximum was moderate.  The degree at present is moderate.  The exacerbating factor is none.  The relieving factor is none.  Risk factors consist of obesity.  Prior episodes: none.  Associated symptoms: none.  This is a 45 year old female who has been having intermittent left arm tightness with some chest discomfort occasionally on and off for 2 days.  She cannot tell me how long they last this morning though its been 3 much constant for the last 2 or 3 hours.  Her it is mostly in her left arm and her left upper arm she feels like a squeezing sensation.  She denies any shortness of breath diaphoresis.  She has had COVID in the last 3 weeks and is being treated for bronchitis.  Breathing does not change it she is not coughing very much she denies any fevers or chills.  She has very few risk factor for heart disease no family history no diabetes hypertension or high cholesterol she is never smoked.  She will need a chest pain work-up however..        Review of Systems   Cardiovascular symptoms:  Chest pain.   Musculoskeletal symptoms:  Muscle pain.             Additional  review of systems information: All other systems reviewed and otherwise negative.      Health Status   Allergies:    Allergic Reactions (All)  No Known Medication Allergies.   Medications:  (Selected)   Inpatient Medications  Ordered  aspirin: 324 mg, 4 tabs, Chewed, Once  Documented Medications  Documented  Dexilant 60 mg oral delayed release capsule: 60 mg, 1 caps, Oral, qPM, 0 Refill(s)  Refresh Dry Eye Therapy: 1 drops, Eye-Both, QID, PRN: dry eyes, 0 Refill(s)  Vitamin D3 50,000 intl units oral capsule: 50,000 units, Oral, qWeek, 0 Refill(s)  Xyzal 5 mg oral tablet: 5 mg, 1 tabs, Oral, qPM, 0 Refill(s)  azelastine-fluticasone (Dymista) 137 mcg-50 mcg/inh nasal spray: 1 sprays, Nasal, BID, PRN: allergic rhinitis/nasal congestion.      Past Medical/ Family/ Social History   Medical history: Reviewed as documented in chart.   Surgical history: Reviewed as documented in chart.   Family history: Not significant.   Social history: Reviewed as documented  in chart.   Problem list:    Active Problems (4)  Acid reflux   Eczema   Environmental and seasonal allergies   Migraines   .      Physical Examination               Vital Signs   Vital Signs   08/11/2020 10:57 EDT Systolic Blood Pressure 158 mmHg  HI    Diastolic Blood Pressure 98 mmHg  HI    Temperature Oral 36.7 degC    Heart Rate Monitored 77 bpm    Respiratory Rate 15 br/min    SpO2 100 %   .   Measurements   08/11/2020 11:00 EDT Body Mass Index est meas 53.84 kg/m2   08/11/2020 11:00 EDT Body Mass Index Measured 53.84 kg/m2   08/11/2020 10:57 EDT Height/Length Measured 157 cm    Weight Dosing 132.7 kg   .   Basic Oxygen Information   08/11/2020 10:57 EDT Oxygen Therapy Room air    SpO2 100 %   .   General:  Alert, no acute distress.    Skin:  Warm, dry, pink.    Head:  Normocephalic, atraumatic.    Neck:  Supple, trachea midline, no tenderness.    Eye:  Extraocular movements are intact, normal conjunctiva.    Ears, nose, mouth and throat:  Oral mucosa moist.    Cardiovascular:  Regular rate and rhythm, No murmur, Normal peripheral perfusion.    Respiratory:  Lungs are clear to auscultation, respirations are non-labored, breath sounds are equal.    Chest wall:  No tenderness, No deformity.    Back:  Nontender, Normal range of motion, Normal alignment.    Musculoskeletal:  Normal ROM, normal strength.    Gastrointestinal:  Soft, Nontender, Non distended.    Neurological:  Alert and oriented to person, place, time, and situation, No focal neurological deficit observed, CN II-XII intact.    Lymphatics:  No lymphadenopathy.   Psychiatric:  Cooperative, appropriate mood & affect.       Medical Decision Making   Differential Diagnosis:  Atypical chest pain.   Documents reviewed:  Emergency department nurses' notes, prior records.       Reexamination/ Reevaluation   Vital signs   Basic Oxygen Information   08/11/2020 10:57 EDT Oxygen Therapy Room air    SpO2 100 %         Impression and Plan   Diagnosis   Atypical chest pain (ICD10-CM R07.89, Discharge, Medical)   Plan   Condition: Stable.    Disposition: Discharged: Time  08/11/2020 13:40:00, to home, not to a nursing home.    Patient was given the following educational materials: Nonspecific Chest Pain, Adult, Nonspecific Chest Pain, Adult.    Follow up with: [facility] Within 1 week, Return to Emergency Department, In: as needed.    Counseled: Patient.    Notes: The patient seems only have left arm squeezing there is no pleuritic pain or shortness of breath no tachycardia her very very mildly elevated D-dimer will not be pursued at this time she was advised to return for shortness of breath or any other concerns.[employer]ed forces training and education officer Signed on 08/11/2020 01:41 PM EDT   ________________________________________________   Precious Gilding C-MD               Modified byPrecious Gilding C-MD on 08/11/2020 01:41 PM EDT

## 2020-08-11 NOTE — ED Notes (Signed)
ED Triage Note       ED Secondary Triage Entered On:  08/11/2020 11:55 EDT    Performed On:  08/11/2020 11:55 EDT by Molli Hazard, RN, Oda Kilts               General Information   Barriers to Learning :   None evident   ED Home Meds Section :   Document assessment   [facility] ED Fall Risk Section :   Document assessment   ED Advance Directives Section :   Document assessment   ED Palliative Screen :   N/A (prefilled for <46yo)   Dalene Carrow - 08/11/2020 11:55 EDT   (As Of: 08/11/2020 11:55:46 EDT)   Problems(Active)    Acid reflux (SNOMED CT  :161096045 )  Name of Problem:   Acid reflux ; Recorder:   Maudie Flakes, RN, Neldon Newport; Confirmation:   Confirmed ; Classification:   Patient Stated ; Code:   [id number] ; Contributor System:   PowerChart ; Last Updated:   07/01/2020 9:42 EDT ; Life Cycle Date:   07/01/2020 ; Life Cycle Status:   Active ; Vocabulary:   SNOMED CT        Eczema (SNOMED CT  :78295621 )  Name of Problem:   Eczema ; Recorder:   Maudie Flakes, RN, Neldon Newport; Confirmation:   Confirmed ; Classification:   Patient Stated ; Code:   [id number] ; Contributor System:   PowerChart ; Last Updated:   07/01/2020 9:43 EDT ; Life Cycle Date:   07/01/2020 ; Life Cycle Status:   Active ; Vocabulary:   SNOMED CT        Environmental and seasonal allergies (SNOMED CT  :6962952841 )  Name of Problem:   Environmental and seasonal allergies ; Recorder:   Maudie Flakes, RN, Neldon Newport; Confirmation:   Confirmed ; Classification:   Patient Stated ; Code:   [id number] ; Contributor System:   PowerChart ; Last Updated:   07/01/2020 9:42 EDT ; Life Cycle Date:   07/01/2020 ; Life Cycle Status:   Active ; Vocabulary:   SNOMED CT        Migraines (SNOMED CT  :53664403 )  Name of Problem:   Migraines ; Recorder:   Maudie Flakes, RN, Neldon Newport; Confirmation:   Confirmed ; Classification:   Patient Stated ; Code:   [id number] ; Contributor System:   PowerChart ; Last Updated:   07/01/2020 9:43 EDT ; Life Cycle Date:   07/01/2020 ; Life Cycle Status:   Active ; Vocabulary:   SNOMED CT           Diagnoses(Active)    Arm pain-swelling  Date:   08/11/2020 ; Diagnosis Type:   Reason For Visit ; Confirmation:   Complaint of ; Clinical Dx:   Arm pain-swelling ; Classification:   Medical ; Clinical Service:   Emergency medicine ; Code:   PNED ; Probability:   0 ; Diagnosis Code:   [id number]      Chest pain  Date:   08/11/2020 ; Diagnosis Type:   Reason For Visit ; Confirmation:   Confirmed ; Clinical Dx:   Chest pain ; Classification:   Medical ; Clinical Service:   Emergency medicine ; Code:   PNED ; Probability:   0 ; Diagnosis Code:   [id number]             -    Procedure History   (As Of: 08/11/2020 11:55:46 EDT)     Procedure Dt/Tm:   07/05/2020  08:56:00 EDT ; Location:   [facility] ; Provider:   Markus Daft; Anesthesia Type:   Monitored Anesthesia Care ; :   Sherlyn Hay W-MD; Anesthesia Minutes:   0 ; Procedure Name:   Colonoscopy ; Procedure Minutes:   13 ; Comments:     07/05/2020 9:11 EDT - CROSBY, RN, ERICA C  auto-populated from documented surgical case ; Clinical Service:   Surgery            Procedure Dt/Tm:   07/05/2020 08:56:00 EDT ; Location:   [facility] ; Provider:   Markus Daft; Anesthesia Type:   Monitored Anesthesia Care ; :   Sherlyn Hay W-MD; Anesthesia Minutes:   0 ; Procedure Name:   Colonoscopy with Polyp Cold ; Procedure Minutes:   13 ; Comments:     07/05/2020 9:11 EDT - CROSBY, RN, ERICA C  auto-populated from documented surgical case ; Clinical Service:   Surgery            Procedure Dt/Tm:   2010 ; Anesthesia Minutes:   0 ; Procedure Name:   CESAREAN SECTION AND TUBAL LIGATION ; Procedure Minutes:   0            Procedure Dt/Tm:   2010 ; Anesthesia Minutes:   0 ; Procedure Name:   Cholecystectomy ; Procedure Minutes:   0            Procedure Dt/Tm:   2017 ; Anesthesia Minutes:   0 ; Procedure Name:   Hysterectomy ; Procedure Minutes:   0            Procedure Dt/Tm:   2021 ; Anesthesia Minutes:   0 ;  Procedure Name:   EGD - Esophagogastroduodenoscopy ; Procedure Minutes:   0            Procedure Dt/Tm:   2008 ; Anesthesia Minutes:   0 ; Procedure Name:   Tonsillectomy ; Procedure Minutes:   0            [facility] Fall Risk Assessment Tool   Hx of falling last 3 months ED Fall :   No   Dalene Carrow - 08/11/2020 11:55 EDT   ED Advance Directive   Advance Directive :   No   Molli Hazard RN, Oda Kilts - 08/11/2020 11:55 EDT   Med Hx   Medication List   (As Of: 08/11/2020 11:55:46 EDT)   Normal Order    aspirin 81 mg Chew Tab  :   aspirin 81 mg Chew Tab ; Status:   Completed ; Ordered As Mnemonic:   aspirin ; Simple Display Line:   324 mg, 4 tabs, Chewed, Once ; Ordering Provider:   LINE,  CHRISTOPHER C-MD; Catalog Code:   aspirin ; Order Dt/Tm:   08/11/2020 11:09:25 EDT            Home Meds    azelastine-fluticasone nasal  :   azelastine-fluticasone nasal ; Status:   Documented ; Ordered As Mnemonic:   azelastine-fluticasone (Dymista) 137 mcg-50 mcg/inh nasal spray ; Simple Display Line:   1 sprays, Nasal, BID, PRN: allergic rhinitis/nasal congestion ; Catalog Code:   azelastine-fluticasone nasal ; Order Dt/Tm:   07/01/2020 09:45:43 EDT          dexlansoprazole  :   dexlansoprazole ; Status:   Documented ; Ordered As Mnemonic:   Dexilant 60 mg oral delayed release capsule ; Simple Display Line:   60 mg, 1  caps, Oral, qPM, 0 Refill(s) ; Catalog Code:   dexlansoprazole ; Order Dt/Tm:   07/01/2020 09:45:13 EDT          levocetirizine  :   levocetirizine ; Status:   Documented ; Ordered As Mnemonic:   Xyzal 5 mg oral tablet ; Simple Display Line:   5 mg, 1 tabs, Oral, qPM, 0 Refill(s) ; Catalog Code:   levocetirizine ; Order Dt/Tm:   07/01/2020 09:45:28 EDT          cholecalciferol  :   cholecalciferol ; Status:   Documented ; Ordered As Mnemonic:   Vitamin D3 50,000 intl units oral capsule ; Simple Display Line:   50,000 units, Oral, qWeek, 0 Refill(s) ; Catalog Code:   cholecalciferol ; Order Dt/Tm:   07/01/2020 09:45:43  EDT          ocular lubricant  :   ocular lubricant ; Status:   Documented ; Ordered As Mnemonic:   Refresh Dry Eye Therapy ; Simple Display Line:   1 drops, Eye-Both, QID, PRN: dry eyes, 0 Refill(s) ; Catalog Code:   ocular lubricant ; Order Dt/Tm:   07/01/2020 09:44:35 EDT

## 2020-08-11 NOTE — Discharge Summary (Signed)
 ED Clinical Summary                     [facility]  [address]  [place], [place], [address]  [phone]          PERSON INFORMATION  Name: Theresa Leonard, Theresa Leonard Age:  45 Years DOB: 24-Mar-1976   Sex: Female Language: English PCP: PCP,  NONE   Marital Status: Married Phone: [phone] Med Service: MED-Medicine   MRN: [id number] Acct# [id number] Arrival: 08/11/2020 10:43:00   Visit Reason: Chest pain; Arm pain-swelling; TIGHTNESS IN LT ARM Acuity: 3 LOS: 000 03:15   Address:    [address] [place] [address]   Diagnosis:    Atypical chest pain  Medications:          Medications that have not changed  Other Medications  azelastine-fluticasone nasal (azelastine-fluticasone (Dymista) 137 mcg-50 mcg/inh nasal spray) 1 Sprays Nasal (into the nose) 2 times a day as needed allergic rhinitis/nasal congestion.  Last Dose:____________________  cholecalciferol (Vitamin D3 50,000 intl units oral capsule) 50,000 Units Oral (given by mouth) every week.  Last Dose:____________________  dexlansoprazole (Dexilant 60 mg oral delayed release capsule) 1 Capsules Oral (given by mouth) once a day (in the evening).  Last Dose:____________________  levocetirizine (Xyzal 5 mg oral tablet) 1 Tabs Oral (given by mouth) once a day (in the evening).  Last Dose:____________________  ocular lubricant (Refresh Dry Eye Therapy) 1 Drops Both eyes 4 times a day as needed dry eyes.  Last Dose:____________________      Medications Administered During Visit:                Medication Dose Route   aspirin 324 mg Chewed               Allergies      No Known Medication Allergies      Major Tests and Procedures:  The following procedures and tests were performed during your ED visit.  COMMON PROCEDURES%>  COMMON PROCEDURES COMMENTS%>                PROVIDER INFORMATION               Provider Role Assigned Sampson SPIRE, LONNI C-MD ED Provider 08/11/2020 10:47:38    Tilman, RN, Alan FALCON ED Nurse  08/11/2020 11:13:44        Attending Physician:  SPIRE LONNI C-MD      Admit Doc  LINE,  CHRISTOPHER C-MD     Consulting Doc       VITALS INFORMATION  Vital Sign Triage Latest   Temp Oral ORAL_1%> ORAL%>   Temp Temporal TEMPORAL_1%> TEMPORAL%>   Temp Intravascular INTRAVASCULAR_1%> INTRAVASCULAR%>   Temp Axillary AXILLARY_1%> AXILLARY%>   Temp Rectal RECTAL_1%> RECTAL%>   02 Sat 100 % 97 %   Respiratory Rate RATE_1%> RATE%>   Peripheral Pulse Rate PULSE RATE_1%>69 bpm PULSE RATE%>   Apical Heart Rate HEART RATE_1%> HEART RATE%>   Blood Pressure BLOOD PRESSURE_1%>/ BLOOD PRESSURE_1%>98 mmHg BLOOD PRESSURE%> / BLOOD PRESSURE%>65 mmHg                 Immunizations      No Immunizations Documented This Visit          DISCHARGE INFORMATION   Discharge Disposition: H Outpt-Sent Home   Discharge Location:  Home   Discharge Date and Time:  08/11/2020 13:58:51   ED Checkout Date and Time:  08/11/2020  13:58:51     DEPART REASON INCOMPLETE INFORMATION               Depart Action Incomplete Reason   Interactive View/I&O Recently assessed               Problems      No Problems Documented              Smoking Status      Never (less than 100 in lifetime)         PATIENT EDUCATION INFORMATION  Instructions:     Nonspecific Chest Pain, Adult     Follow up:                   With: Address: When:   [facility] Call for appt & office location   [phone] Business (1) Within 1 week              ED PROVIDER DOCUMENTATION     Patient:   Theresa Leonard            MRN: [id number]            FIN: [id number]               Age:   55 years     Sex:  Female     DOB:  27-Mar-1976   Associated Diagnoses:   Atypical chest pain   Author:   DELANNA BRUCKNER C-MD      Basic Information   Time seen: Provider Seen (ST)   ED Provider/Time:    LINE,  CHRISTOPHER C-MD / 08/11/2020 10:47  .   Additional information: Chief Complaint from Nursing Triage Note   Chief Complaint  Chief Complaint: Pt c/o intermitent tightness to her left arm  for 2 days (08/11/20 10:57:00).      History of Present Illness   The patient presents with chest pain.  The onset was 2  days ago.  The course/duration of symptoms is fluctuating in intensity.  Location: Left arm. Radiating pain: none. The character of symptoms is heaviness and tightness.  The degree at onset was moderate.  The degree at maximum was moderate.  The degree at present is moderate.  The exacerbating factor is none.  The relieving factor is none.  Risk factors consist of obesity.  Prior episodes: none.  Associated symptoms: none.  This is a 45 year old female who has been having intermittent left arm tightness with some chest discomfort occasionally on and off for 2 days.  She cannot tell me how long they last this morning though its been 3 much constant for the last 2 or 3 hours.  Her it is mostly in her left arm and her left upper arm she feels like a squeezing sensation.  She denies any shortness of breath diaphoresis.  She has had COVID in the last 3 weeks and is being treated for bronchitis.  Breathing does not change it she is not coughing very much she denies any fevers or chills.  She has very few risk factor for heart disease no family history no diabetes hypertension or high cholesterol she is never smoked.  She will need a chest pain work-up however..        Review of Systems   Cardiovascular symptoms:  Chest pain.   Musculoskeletal symptoms:  Muscle pain.             Additional review of systems information: All other systems reviewed and otherwise negative.  Health Status   Allergies:    Allergic Reactions (All)  No Known Medication Allergies.   Medications:  (Selected)   Inpatient Medications  Ordered  aspirin: 324 mg, 4 tabs, Chewed, Once  Documented Medications  Documented  Dexilant 60 mg oral delayed release capsule: 60 mg, 1 caps, Oral, qPM, 0 Refill(s)  Refresh Dry Eye Therapy: 1 drops, Eye-Both, QID, PRN: dry eyes, 0 Refill(s)  Vitamin D3 50,000 intl units oral capsule: 50,000  units, Oral, qWeek, 0 Refill(s)  Xyzal 5 mg oral tablet: 5 mg, 1 tabs, Oral, qPM, 0 Refill(s)  azelastine-fluticasone (Dymista) 137 mcg-50 mcg/inh nasal spray: 1 sprays, Nasal, BID, PRN: allergic rhinitis/nasal congestion.      Past Medical/ Family/ Social History   Medical history: Reviewed as documented in chart.   Surgical history: Reviewed as documented in chart.   Family history: Not significant.   Social history: Reviewed as documented in chart.   Problem list:    Active Problems (4)  Acid reflux   Eczema   Environmental and seasonal allergies   Migraines   .      Physical Examination               Vital Signs   Vital Signs   08/11/2020 10:57 EDT Systolic Blood Pressure 158 mmHg  HI    Diastolic Blood Pressure 98 mmHg  HI    Temperature Oral 36.7 degC    Heart Rate Monitored 77 bpm    Respiratory Rate 15 br/min    SpO2 100 %   .   Measurements   08/11/2020 11:00 EDT Body Mass Index est meas 53.84 kg/m2   08/11/2020 11:00 EDT Body Mass Index Measured 53.84 kg/m2   08/11/2020 10:57 EDT Height/Length Measured 157 cm    Weight Dosing 132.7 kg   .   Basic Oxygen Information   08/11/2020 10:57 EDT Oxygen Therapy Room air    SpO2 100 %   .   General:  Alert, no acute distress.    Skin:  Warm, dry, pink.    Head:  Normocephalic, atraumatic.    Neck:  Supple, trachea midline, no tenderness.    Eye:  Extraocular movements are intact, normal conjunctiva.    Ears, nose, mouth and throat:  Oral mucosa moist.   Cardiovascular:  Regular rate and rhythm, No murmur, Normal peripheral perfusion.    Respiratory:  Lungs are clear to auscultation, respirations are non-labored, breath sounds are equal.    Chest wall:  No tenderness, No deformity.    Back:  Nontender, Normal range of motion, Normal alignment.    Musculoskeletal:  Normal ROM, normal strength.    Gastrointestinal:  Soft, Nontender, Non distended.    Neurological:  Alert and oriented to person, place, time, and situation, No focal neurological deficit observed, CN II-XII  intact.    Lymphatics:  No lymphadenopathy.   Psychiatric:  Cooperative, appropriate mood & affect.       Medical Decision Making   Differential Diagnosis:  Atypical chest pain.   Documents reviewed:  Emergency department nurses' notes, prior records.       Reexamination/ Reevaluation   Vital signs   Basic Oxygen Information   08/11/2020 10:57 EDT Oxygen Therapy Room air    SpO2 100 %         Impression and Plan   Diagnosis   Atypical chest pain (ICD10-CM R07.89, Discharge, Medical)   Plan   Condition: Stable.    Disposition: Discharged: Time  08/11/2020 13:40:00, to home,  not to a nursing home.    Patient was given the following educational materials: Nonspecific Chest Pain, Adult, Nonspecific Chest Pain, Adult.    Follow up with: [facility] Within 1 week, Return to Emergency Department, In: as needed.    Counseled: Patient.    Notes: The patient seems only have left arm squeezing there is no pleuritic pain or shortness of breath no tachycardia her very very mildly elevated D-dimer will not be pursued at this time she was advised to return for shortness of breath or any other concerns.SABRA

## 2020-08-11 NOTE — ED Notes (Signed)
 ED Triage Note       ED Triage Adult Entered On:  08/11/2020 11:00 EDT    Performed On:  08/11/2020 10:57 EDT by Elnor Therisa SAILOR               Triage   Numeric Rating Pain Scale :   3   Chief Complaint :   Pt c/o intermitent tightness to her left arm for 2 days   [place] Mode of Arrival :   Private vehicle   Infectious Disease Documentation :   Document assessment   Patient received chemo or biotherapy last 48 hrs? :   No   Temperature Oral :   36.7 degC(Converted to: 98.1 degF)    Heart Rate Monitored :   77 bpm   Respiratory Rate :   15 br/min   Systolic Blood Pressure :   158 mmHg (HI)    Diastolic Blood Pressure :   98 mmHg (HI)    SpO2 :   100 %   Oxygen Therapy :   Room air   Patient presentation :   None of the above   Chief Complaint or Presentation suggest infection :   No   Dosing Weight Obtained By :   Measured   Weight Dosing :   132.7 kg(Converted to: 292 lb 9 oz)    Height :   157 cm(Converted to: 5 ft 2 in)    Body Mass Index Dosing :   54 kg/m2   Elnor Therisa SAILOR - 08/11/2020 10:57 EDT   DCP GENERIC CODE   Tracking Acuity :   3   Tracking Group :   ED [employer]acking [employer]   Elnor Therisa SAILOR - 08/11/2020 10:57 EDT   ED General Section :   Document assessment   Pregnancy Status :   Patient denies   ED Allergies Section :   Document assessment   ED Reason for Visit Section :   Document assessment   Elnor Therisa SAILOR - 08/11/2020 10:57 EDT   ID Risk Screen Symptoms   Recent Travel History :   No recent travel   TB Symptom Screen :   No symptoms   Last 90 days COVID-19 ID :   No   Close Contact with COVID-19 ID :   No   Last 14 days COVID-19 ID :   No   C. diff Symptom/History ID :   Neither of the above   Patient Pregnant :   None of the above   MRSA/VRE Screening :   None of these apply   CRE Screening :   Not applicable   Elnor Therisa SAILOR - 08/11/2020 10:57 EDT   Allergies   (As Of: 08/11/2020 11:00:58 EDT)   Allergies (Active)   No Known Medication Allergies  Estimated Onset Date:   Unspecified ; Created By:   Casimer OBIE Lonell KANDICE; Reaction Status:   Active ; Category:   Drug ; Substance:   No Known Medication Allergies ; Type:   Allergy ; Updated By:   Casimer OBIE Lonell KANDICE; Reviewed Date:   08/11/2020 10:58 EDT        Psycho-Social   Last 3 mo, thoughts killing self/others :   Patient denies   Right click within box for Suspected Abuse policy link. :   None   Feels Safe Where Live :   Yes   ED Behavioral Activity Rating Scale :   4 -  Quiet and awake (normal level of activity)   Elnor Therisa SAILOR - 08/11/2020 10:57 EDT   ED Reason for Visit   (As Of: 08/11/2020 11:00:58 EDT)   Problems(Active)    Acid reflux (SNOMED CT  :646864985 )  Name of Problem:   Acid reflux ; Recorder:   Casimer, RN, Lonell MATSU; Confirmation:   Confirmed ; Classification:   Patient Stated ; Code:   646864985 ; Contributor System:   PowerChart ; Last Updated:   07/01/2020 9:42 EDT ; Life Cycle Date:   07/01/2020 ; Life Cycle Status:   Active ; Vocabulary:   SNOMED CT        Eczema (SNOMED CT  :28076982 )  Name of Problem:   Eczema ; Recorder:   Casimer, RN, Lonell MATSU; Confirmation:   Confirmed ; Classification:   Patient Stated ; Code:   28076982 ; Contributor System:   PowerChart ; Last Updated:   07/01/2020 9:43 EDT ; Life Cycle Date:   07/01/2020 ; Life Cycle Status:   Active ; Vocabulary:   SNOMED CT        Environmental and seasonal allergies (SNOMED CT  :7041296982 )  Name of Problem:   Environmental and seasonal allergies ; Recorder:   Casimer, RN, Lonell MATSU; Confirmation:   Confirmed ; Classification:   Patient Stated ; Code:   7041296982 ; Contributor System:   PowerChart ; Last Updated:   07/01/2020 9:42 EDT ; Life Cycle Date:   07/01/2020 ; Life Cycle Status:   Active ; Vocabulary:   SNOMED CT        Migraines (SNOMED CT  :36944985 )  Name of Problem:   Migraines ; Recorder:   Casimer, RN, Lonell MATSU; Confirmation:   Confirmed ; Classification:   Patient Stated ; Code:   36944985 ; Contributor System:   PowerChart ; Last Updated:   07/01/2020 9:43 EDT ; Life Cycle Date:   07/01/2020 ;  Life Cycle Status:   Active ; Vocabulary:   SNOMED CT          Diagnoses(Active)    Arm pain-swelling  Date:   08/11/2020 ; Diagnosis Type:   Reason For Visit ; Confirmation:   Complaint of ; Clinical Dx:   Arm pain-swelling ; Classification:   Medical ; Clinical Service:   Emergency medicine ; Code:   PNED ; Probability:   0 ; Diagnosis Code:   [id number]

## 2020-08-11 NOTE — ED Notes (Signed)
 ED Patient Summary       ;       [facility]  [address] [place], [place] [address]  [phone]  Discharge Instructions (Patient)  Name: GRACEYN, FODOR  DOB: May 01, 1975                   MRN: [id number]                   FIN: [id number]  Reason For Visit: Chest pain; Arm pain-swelling; TIGHTNESS IN LT ARM  Final Diagnosis: Atypical chest pain     Visit Date: 08/11/2020 10:43:00  Address: [address] [place] [address]  Phone: [phone]     Emergency Department Providers:        Primary Physician:      DELANNA LONNI JAYSON [facility] would like to thank you for allowing us  to assist you with your healthcare needs. The following includes patient education materials and information regarding your injury/illness.     Follow-up Instructions:  You were seen today on an emergency basis. Please contact your primary care doctor for a follow up appointment. If you received a referral to a specialist doctor, it is important you follow-up as instructed.    It is important that you call your follow-up doctor to schedule and confirm the location of your next appointment. Your doctor may practice at multiple locations. The office location of your follow-up appointment may be different to the one written on your discharge instructions.    If you do not have a primary care doctor, please call [phone] for help in finding a [facility] Provider. For help in finding a specialist doctor, please call [phone].    If your condition gets worse before your follow-up with your primary care doctor or specialist, please return to the Emergency Department.      Coronavirus 2019 (COVID-19) Reminders:     Patients age 16 - 38, with parental consent, and patients over age 72 can make an appointment for a COVID-19 vaccine. Patients can contact their [facility] doctors' offices to schedule an appointment to receive the  COVID-19 vaccine. Patients who do not have a [facility] physician can call [phone] to schedule vaccination appointments.      Follow Up Appointments:  Primary Care Provider:     Name: PCP,  NONE     Phone:                  With: Address: When:   [facility] Call for appt & office location   [phone] Business (1) Within 1 week              Post [facility] SERVICES%>          Medications that have not changed  Other Medications  azelastine-fluticasone nasal (azelastine-fluticasone (Dymista) 137 mcg-50 mcg/inh nasal spray) 1 Sprays Nasal (into the nose) 2 times a day as needed allergic rhinitis/nasal congestion.  Last Dose:____________________  cholecalciferol (Vitamin D3 50,000 intl units oral capsule) 50,000 Units Oral (given by mouth) every week.  Last Dose:____________________  dexlansoprazole (Dexilant 60 mg oral delayed release capsule) 1 Capsules Oral (given by mouth) once a day (in the evening).  Last Dose:____________________  levocetirizine (Xyzal 5 mg oral tablet) 1 Tabs Oral (given by mouth) once a day (in the evening).  Last Dose:____________________  ocular lubricant (Refresh Dry Eye Therapy) 1 Drops Both eyes 4 times a day as needed dry eyes.  Last Dose:____________________      Allergy Info: No Known Medication Allergies     Discharge Additional Information          Discharge Patient 08/11/20 13:41:00 EDT      Patient Education Materials:        Nonspecific Chest Pain, Adult    Chest pain can be caused by many different conditions. It can be caused by a condition that is life-threatening and requires treatment right away. It can also be caused by something that is not life-threatening. If you have chest pain, it can be hard to know the difference, so it is important to get help right away to make sure that you do not have a serious condition.    Some life-threatening causes of chest pain include:   Heart attack.     A tear in the body's main blood vessel  (aortic dissection).     Inflammation around your heart (pericarditis).     A problem in the lungs, such as a blood clot (pulmonary embolism) or a collapsed lung (pneumothorax).      Some non life-threatening causes of chest pain include:   Heartburn.     Anxiety or stress.     Damage to the bones, muscles, and cartilage that make up your chest wall.     Pneumonia or bronchitis.     Shingles infection (varicella-zoster virus).      Chest pain can feel like:   Pain or discomfort on the surface of your chest or deep in your chest.     Crushing, pressure, aching, or squeezing pain.     Burning or tingling.     Dull or sharp pain that is worse when you move, cough, or take a deep breath.     Pain or discomfort that is also felt in your back, neck, jaw, shoulder, or arm, or pain that spreads to any of these areas.      Your chest pain may come and go. It may also be constant. Your health care provider will do lab tests and other studies to find the cause of your pain. Treatment will depend on the cause of your chest pain.      Follow these instructions at home:    Medicines     Take over-the-counter and prescription medicines only as told by your health care provider.     If you were prescribed an antibiotic, take it as told by your health care provider. Do not stop taking the antibiotic even if you start to feel better.      Lifestyle       Rest as directed by your health care provider.     Do not use any products that contain nicotine or tobacco, such as cigarettes and e-cigarettes. If you need help quitting, ask your health care provider.     Do not drink alcohol.     Make healthy lifestyle choices as recommended. These may include:  ? Getting regular exercise. Ask your health care provider to suggest some activities that are safe for you.    ? Eating a heart-healthy diet. This includes plenty of fresh fruits and vegetables, whole grains, low-fat (lean) protein, and low-fat dairy products. A dietitian can help you  find healthy eating options.    ? Maintaining a healthy weight.    ? Managing any other health conditions you have, such  as high blood pressure (hypertension) or diabetes.    ? Reducing stress, such as with yoga or relaxation techniques.        General instructions     Pay attention to any changes in your symptoms. Tell your health care provider about them or any new symptoms.     Avoid any activities that cause chest pain.     Keep all follow-up visits as told by your health care provider. This is important. This includes visits for any further testing if your chest pain does not go away.        Contact a health care provider if:     Your chest pain does not go away.     You feel depressed.     You have a fever.      Get help right away if:     Your chest pain gets worse.     You have a cough that gets worse, or you cough up blood.     You have severe pain in your abdomen.     You faint.     You have sudden, unexplained chest discomfort.     You have sudden, unexplained discomfort in your arms, back, neck, or jaw.     You have shortness of breath at any time.     You suddenly start to sweat, or your skin gets clammy.     You feel nausea or you vomit.     You suddenly feel lightheaded or dizzy.     You have severe weakness, or unexplained weakness or fatigue.     Your heart begins to beat quickly, or it feels like it is skipping beats.    These symptoms may represent a serious problem that is an emergency. Do not wait to see if the symptoms will go away. Get medical help right away. Call your local emergency services (911 in the [place] Do not drive yourself to the hospital.      Summary     Chest pain can be caused by a condition that is serious and requires urgent treatment. It may also be caused by something that is not life-threatening.     If you have chest pain, it is very important to see your health care provider. Your health care provider may do lab tests and other studies to find the cause of your pain.      Follow your health care provider's instructions on taking medicines, making lifestyle changes, and getting emergency treatment if symptoms become worse.     Keep all follow-up visits as told by your health care provider. This includes visits for any further testing if your chest pain does not go away.      This information is not intended to replace advice given to you by your health care provider. Make sure you discuss any questions you have with your health care provider.      Document Revised: 10/03/2017 Document Reviewed: 10/03/2017  Elsevier Patient Education ? 2021 [employer]vier Inc[employer]      ---------------------------------------------------------------------------------------------------------------------  [facility] allows patients to review your COVID and other test results as well as discharge documents from any [facility], Emergency Department, surgical center or outpatient lab. Test results are typically available 36 hours after the test is completed.     [facility] encourages you to self-enroll in the [facility] Patient Portal.     To begin your self-enrollment process, please visit [email or web address] Under [facility],  click on "Sign up now".     NOTE: You must be 16 years and older to use [facility] Self-Enroll online. If you are a parent, caregiver, or guardian; you need an invite to access your child's or dependent's health records. To obtain an invite, contact the Medical Records department at [phone] Monday through Friday, 8-4:30, select option 3 . If we receive your call afterhours, we will return your call the next business day.     If you have issues trying to create or access your account, contact [employer] support at [phone] available 7 days a week 24 hours a day.     Comment:

## 2022-12-11 DIAGNOSIS — R55 Syncope and collapse: Secondary | ICD-10-CM

## 2022-12-11 DIAGNOSIS — I959 Hypotension, unspecified: Secondary | ICD-10-CM

## 2022-12-11 NOTE — ED Provider Notes (Signed)
 [facility] EMERGENCY DEPT  EMERGENCY DEPARTMENT ENCOUNTER      Pt Name: Theresa Leonard  MRN: [id number]  Birthdate 09-28-1975  Date of evaluation: 12/11/2022  Provider evaluation time: 12/11/22 2347  Provider: Lonni Adine Reichmann, MD    CHIEF COMPLAINT       Chief Complaint   Patient presents with    Dizziness     Pt BIBA after feeling dizzy and feeling as if she were to pass out. EMS reports that she was hypotensive and had orthostatic hypotension, she received 1L NS and bp increased. Pt no longer feeling dizzy or lightheaded.         HISTORY OF PRESENT ILLNESS    HPI  47 year old female presents for evaluation of near syncope.  Patient was watching TV normal state of health sitting down when she suddenly felt flushed lightheaded and short of breath.  EMS was called and found her to be hypotensive at the time started some IV fluids and transported here upon arrival ear feels better.  No chest pain or pleuritic pain no palpitations no no abdominal pain she has been having bouts of loose stool over the lastSeveral days not black or bloody.  Denies pregnancy.  Nursing Notes were reviewed.    REVIEW OF SYSTEMS       Review of Systems   Constitutional:  Negative for fatigue and fever.   Respiratory:  Positive for shortness of breath.        Except as noted above the remainder of the review of systems was reviewed and negative.     PAST MEDICAL HISTORY   No past medical history on file.  SURGICAL HISTORY     No past surgical history on file.  CURRENT MEDICATIONS       Previous Medications    No medications on file     ALLERGIES     Patient has no known allergies.  FAMILY HISTORY     No family history on file.  SOCIAL HISTORY       Social History     Socioeconomic History    Marital status: Married     SCREENINGS       Glasgow Coma Scale  Eye Opening: Spontaneous  Best Verbal Response: Oriented  Best Motor Response: Obeys commands  Glasgow Coma Scale Score: 15             CIWA Assessment  BP: 133/85  Pulse: 80            PHYSICAL EXAM       ED Triage Vitals   BP Systolic BP Percentile Diastolic BP Percentile Temp Temp Source Pulse Respirations SpO2   12/11/22 2349 -- -- 12/11/22 2349 12/11/22 2347 12/11/22 2347 12/11/22 2347 12/11/22 2347   117/80   97.5 F (36.4 C) Oral 73 22 100 %      Height Weight - Scale         12/11/22 2347 12/11/22 2347         1.575 m (5' 2) 127 kg (280 lb)             Physical Exam  Vitals and nursing note reviewed.   Constitutional:       Appearance: She is obese.   HENT:      Head: Normocephalic and atraumatic.      Right Ear: External ear normal.      Left Ear: External ear normal.      Mouth/Throat:      Mouth: Mucous membranes  are moist.   Eyes:      Conjunctiva/sclera: Conjunctivae normal.   Cardiovascular:      Rate and Rhythm: Normal rate and regular rhythm.   Pulmonary:      Effort: Pulmonary effort is normal. No respiratory distress.      Breath sounds: Normal breath sounds. No wheezing.   Abdominal:      General: Abdomen is flat. There is no distension.      Palpations: Abdomen is soft.      Tenderness: There is no abdominal tenderness.   Musculoskeletal:         General: No tenderness or deformity. Normal range of motion.      Cervical back: No rigidity.      Right lower leg: No edema.      Left lower leg: No edema.   Skin:     General: Skin is warm and dry.      Coloration: Skin is not pale.   Neurological:      General: No focal deficit present.      Mental Status: She is alert and oriented to person, place, and time.      Gait: Gait normal.   Psychiatric:         Mood and Affect: Mood normal.         Thought Content: Thought content normal.         DIAGNOSTIC RESULTS       PROCEDURES:  Unless otherwise noted below, none     Procedures    EKG: All EKG's are interpreted by the Emergency Department Physician who either signs or Co-signs this chart in the absence of a cardiologist.    LABS:  Labs Reviewed   CBC WITH AUTO DIFFERENTIAL - Abnormal; Notable for the following components:        Result Value    Immature Granulocytes % 0.7 (*)     All other components within normal limits   COMPREHENSIVE METABOLIC PANEL - Abnormal; Notable for the following components:    Potassium Hemolyzed (*)     Glucose 166 (*)     Calcium 8.3 (*)     Albumin 3.3 (*)     Alk Phosphatase 125 (*)     All other components within normal limits   MAGNESIUM   TROPONIN   TROPONIN   D-DIMER, QUANTITATIVE   POTASSIUM       All other labs were within normal range or not returned as of this dictation.    RADIOLOGY:   Non-plain film images such as CT, Ultrasound and MRI are read by the radiologist. Plain radiographic images are visualized and preliminarily interpreted by the emergency physician with the below findings:    Interpretation per the Radiologist below, if available at the time of this note:    No orders to display     EMERGENCY DEPARTMENT COURSE/REASSESSMENT and MDM:   Vitals:    Vitals:    12/12/22 0000 12/12/22 0015 12/12/22 0030 12/12/22 0045   BP: 123/68 133/85     Pulse: 71 75 80 80   Resp: 19 18 20 19    Temp:       TempSrc:       SpO2: 100% 100% 99% 100%   Weight:       Height:           ED Course:    ED Course as of 12/12/22 0134   Wed Dec 12, 2022   0000 Near syncopal event while sitting will  be evaluating for possible arrhythmia volume depletion pulmonary embolus laboratory data cardiac monitoring to be performed [CM]   0001 Additional history obtained from EMS [CM]   0024 EKG per my interpretation sinus rhythm rate of 70 no acute ST-T wave changes [CM]   0105 Laboratory data reassuring.  D-dimer normal troponin less than 6 will have a repeat troponin pending [CM]   0130 Laboratory data interpreted by me thus far labs have been reassuring.  Blood pressure stable she feels well.  Repeat troponin is pending will check orthostatics after fluids. [CM]   0133 Second troponin reassuring and less than 6. [CM]   0133 Patient does state that prior to today she did have multiple loose stools so some degree of volume  depletion is suspected. [CM]      ED Course User Index  [CM] Abram Lonni Cain, MD       MDM     Amount and/or Complexity of Data Reviewed  Clinical lab tests: reviewed  Tests in the medicine section of CPT: reviewed        FINAL IMPRESSION      1. Near syncope    2. Hypotension due to hypovolemia          DISPOSITION/PLAN   DISPOSITION Decision To Discharge 12/12/2022 01:34:14 AM  Condition at Disposition: Stable      PATIENT REFERRED TO:  No follow-up provider specified.    DISCHARGE MEDICATIONS:  New Prescriptions    No medications on file       (Please note that portions of this note were completed with a voice recognition program.  Efforts were made to edit the dictations but occasionally words are mis-transcribed.)    Lonni Cain Abram, MD (electronically signed)  Attending Emergency Physician           Abram Lonni Cain, MD  12/12/22 [phone]

## 2022-12-12 ENCOUNTER — Inpatient Hospital Stay
Admit: 2022-12-12 | Discharge: 2022-12-12 | Disposition: A | Discharge status: Home / Self Care | Payer: BLUE CROSS/BLUE SHIELD | Attending: Emergency Medicine

## 2022-12-12 LAB — CBC WITH AUTO DIFFERENTIAL
Basophils %: 0.4 % (ref 0.0–2.0)
Basophils Absolute: 0 10*3/uL (ref 0.0–0.2)
Eosinophils %: 1.7 % (ref 0.0–7.0)
Eosinophils Absolute: 0.1 10*3/uL (ref 0.0–0.5)
Hematocrit: 36.1 % (ref 34.0–47.0)
Hemoglobin: 12.4 g/dL (ref 11.5–15.7)
Immature Grans (Abs): 0.06 10*3/uL (ref 0.00–0.06)
Immature Granulocytes %: 0.7 % — ABNORMAL HIGH (ref 0.0–0.6)
Lymphocytes Absolute: 1.9 10*3/uL (ref 1.0–3.2)
Lymphocytes: 22.3 % (ref 15.0–45.0)
MCH: 29.7 pg (ref 27.0–34.5)
MCHC: 34.3 g/dL (ref 30.0–36.0)
MCV: 86.6 fL (ref 81.0–99.0)
MPV: 11.6 fL (ref 7.0–12.2)
Monocytes %: 7.7 % (ref 4.0–12.0)
Monocytes Absolute: 0.7 10*3/uL (ref 0.3–1.0)
NRBC Absolute: 0 10*3/uL (ref 0.000–0.012)
NRBC Automated: 0 % (ref 0.0–0.2)
Neutrophils %: 67.2 % (ref 42.0–74.0)
Neutrophils Absolute: 5.7 10*3/uL (ref 1.6–7.3)
Platelets: 212 10*3/uL (ref 140–440)
RBC: 4.17 x10e6/mcL (ref 3.60–5.20)
RDW: 14.4 % (ref 10.0–17.0)
WBC: 8.4 10*3/uL (ref 3.8–10.6)

## 2022-12-12 LAB — COMPREHENSIVE METABOLIC PANEL
ALT: 18 U/L (ref 0–35)
AST: 27 U/L (ref 0–35)
Albumin/Globulin Ratio: 1.03 (ref 1.00–2.70)
Albumin: 3.3 g/dL — ABNORMAL LOW (ref 3.5–5.2)
Alk Phosphatase: 125 U/L — ABNORMAL HIGH (ref 35–117)
Anion Gap: 9 mmol/L (ref 2–17)
BUN: 11 mg/dL (ref 6–20)
CALCIUM,CORRECTED,CCA: 8.9 mg/dL (ref 8.5–10.7)
CO2: 25 mmol/L (ref 22–29)
Calcium: 8.3 mg/dL — ABNORMAL LOW (ref 8.5–10.7)
Chloride: 104 mmol/L (ref 98–107)
Creatinine: 0.9 mg/dL (ref 0.5–1.0)
Est, Glom Filt Rate: 79 mL/min/1.73mÂ² (ref >=60)
Globulin: 3.2 g/dL (ref 1.9–4.4)
Glucose: 166 mg/dL — ABNORMAL HIGH (ref 70–99)
Osmolaliy Calculated: 279 mosm/kg (ref 270–287)
Sodium: 138 mmol/L (ref 135–145)
Total Bilirubin: 0.5 mg/dL (ref 0.00–1.20)
Total Protein: 6.5 g/dL (ref 5.7–8.3)

## 2022-12-12 LAB — D-DIMER, QUANTITATIVE: D-Dimer, Quant: 0.44 CD:295178345 (ref 0.19–0.50)

## 2022-12-12 LAB — POTASSIUM: Potassium: 3.6 mmol/L (ref 3.5–5.3)

## 2022-12-12 LAB — TROPONIN
Troponin, High Sensitivity: <6 ng/L (ref 0–14)
Troponin, High Sensitivity: <6 ng/L (ref 0–14)

## 2022-12-12 LAB — MAGNESIUM: Magnesium: 2 mg/dL (ref 1.6–2.6)

## 2022-12-12 MED ORDER — LACTATED RINGERS IV BOLUS
Freq: Once | INTRAVENOUS | Status: AC
Start: 2022-12-12 — End: 2022-12-12

## 2022-12-12 MED ADMIN — lactated ringers bolus 1,000 mL: 1000 mL | INTRAVENOUS | @ 04:00:00 | NDC 00338011704

## 2022-12-12 NOTE — Discharge Instructions (Signed)
 Follow-up with your primary care doctor later this week for recheck return for fevers chills black or bloody stools chest pain shortness of breath or any concern

## 2022-12-17 LAB — EKG 12-LEAD
P Axis: 41 degrees
P-R Interval: 146 ms
Q-T Interval: 382 ms
QRS Duration: 90 ms
QTc Calculation (Bazett): 403 ms
R Axis: 62 degrees
T Axis: 34 degrees
Ventricular Rate: 70 {beats}/min

## 2024-03-24 ENCOUNTER — Encounter

## 2024-10-06 ENCOUNTER — Inpatient Hospital Stay: Payer: BLUE CROSS/BLUE SHIELD
